# Patient Record
Sex: Female | Born: 1995 | Race: Black or African American | Hispanic: No | Marital: Single | Smoking: Never smoker
Health system: Southern US, Community
[De-identification: ages and names within clinical notes are randomized; demographics above are authoritative.]

## PROBLEM LIST (undated history)

## (undated) DIAGNOSIS — N6009 Solitary cyst of unspecified breast: Secondary | ICD-10-CM

---
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
## 2015-04-02 ENCOUNTER — Emergency Department
Admit: 2015-04-02 | Disposition: A | Discharge status: Home / Self Care | Payer: Self-pay | Admitting: Emergency Medicine

## 2015-04-02 LAB — URINALYSIS, COMPLETE
Bacteria: NONE SEEN
Bilirubin,UR: NEGATIVE
Blood: NEGATIVE
Glucose,UR: NEGATIVE mg/dL (ref 0–75)
NITRITE: NEGATIVE
Ph: 6 (ref 4.5–8.0)
Specific Gravity: 1.027 (ref 1.003–1.030)

## 2015-07-01 ENCOUNTER — Encounter: Payer: Self-pay | Admitting: Emergency Medicine

## 2015-07-01 ENCOUNTER — Emergency Department
Admission: EM | Admit: 2015-07-01 | Discharge: 2015-07-01 | Disposition: A | Discharge status: Home / Self Care | Attending: Emergency Medicine | Admitting: Emergency Medicine

## 2015-07-01 DIAGNOSIS — Z3202 Encounter for pregnancy test, result negative: Secondary | ICD-10-CM | POA: Diagnosis not present

## 2015-07-01 DIAGNOSIS — M545 Low back pain: Secondary | ICD-10-CM | POA: Diagnosis not present

## 2015-07-01 DIAGNOSIS — N946 Dysmenorrhea, unspecified: Secondary | ICD-10-CM

## 2015-07-01 DIAGNOSIS — R109 Unspecified abdominal pain: Secondary | ICD-10-CM | POA: Diagnosis present

## 2015-07-01 HISTORY — DX: Solitary cyst of unspecified breast: N60.09

## 2015-07-01 LAB — COMPREHENSIVE METABOLIC PANEL
ALT: 11 U/L — ABNORMAL LOW (ref 14–54)
AST: 18 U/L (ref 15–41)
Albumin: 4.5 g/dL (ref 3.5–5.0)
Alkaline Phosphatase: 49 U/L (ref 38–126)
Anion gap: 4 — ABNORMAL LOW (ref 5–15)
BILIRUBIN TOTAL: 0.3 mg/dL (ref 0.3–1.2)
BUN: 13 mg/dL (ref 6–20)
CHLORIDE: 110 mmol/L (ref 101–111)
CO2: 25 mmol/L (ref 22–32)
CREATININE: 0.75 mg/dL (ref 0.44–1.00)
Calcium: 9 mg/dL (ref 8.9–10.3)
GFR calc Af Amer: >60 mL/min (ref >60)
GFR calc non Af Amer: >60 mL/min (ref >60)
GLUCOSE: 109 mg/dL — AB (ref 65–99)
POTASSIUM: 3.5 mmol/L (ref 3.5–5.1)
Sodium: 139 mmol/L (ref 135–145)
Total Protein: 7.7 g/dL (ref 6.5–8.1)

## 2015-07-01 LAB — POCT PREGNANCY, URINE: PREG TEST UR: NEGATIVE

## 2015-07-01 LAB — URINALYSIS COMPLETE WITH MICROSCOPIC (ARMC ONLY)
Bacteria, UA: NONE SEEN
Bilirubin Urine: NEGATIVE
Glucose, UA: NEGATIVE mg/dL
KETONES UR: NEGATIVE mg/dL
Leukocytes, UA: NEGATIVE
NITRITE: NEGATIVE
PH: 6 (ref 5.0–8.0)
PROTEIN: NEGATIVE mg/dL
Specific Gravity, Urine: 1.024 (ref 1.005–1.030)

## 2015-07-01 LAB — LIPASE, BLOOD: LIPASE: 18 U/L — AB (ref 22–51)

## 2015-07-01 LAB — CBC
HCT: 37.9 % (ref 35.0–47.0)
Hemoglobin: 12.3 g/dL (ref 12.0–16.0)
MCH: 26.5 pg (ref 26.0–34.0)
MCHC: 32.5 g/dL (ref 32.0–36.0)
MCV: 81.7 fL (ref 80.0–100.0)
Platelets: 339 10*3/uL (ref 150–440)
RBC: 4.64 MIL/uL (ref 3.80–5.20)
RDW: 17 % — ABNORMAL HIGH (ref 11.5–14.5)
WBC: 8 10*3/uL (ref 3.6–11.0)

## 2015-07-01 MED ORDER — KETOROLAC TROMETHAMINE 60 MG/2ML IM SOLN
60.0000 mg | Freq: Once | INTRAMUSCULAR | Status: AC
  Administered 2015-07-01: 60 mg via INTRAMUSCULAR
  Filled 2015-07-01: qty 2

## 2015-07-01 NOTE — ED Notes (Signed)
Pt reports she is having period pains that are shooting down both legs and back. Menstral cycle started yesterday pains started today. Pt states she gets the same shooting pain down her legs every time she gets her period. Pt also states generalized abd pain.

## 2015-07-01 NOTE — ED Notes (Signed)
Pt states that she started her menstral cycle last night, pt states that she is having lower back pain that radiates into her legs. Pt states that she has a lot of pain each time she has a cycle but states that she has been unable to get any relief with any home remedies she has attempted. Pt also states that her period is usually pretty heavy as well pt estimates that she changes her pad approx 5 times an hour at it's heaviest. No distress noted at this time.

## 2015-07-01 NOTE — Discharge Instructions (Signed)
Please give the OB/GYN doctor a call to schedule an appointment. Please seek medical attention for any high fevers, chest pain, shortness of breath, change in behavior, persistent vomiting, bloody stool or any other new or concerning symptoms.  Dysmenorrhea Menstrual cramps (dysmenorrhea) are caused by the muscles of the uterus tightening (contracting) during a menstrual period. For some women, this discomfort is merely bothersome. For others, dysmenorrhea can be severe enough to interfere with everyday activities for a few days each month. Primary dysmenorrhea is menstrual cramps that last a couple of days when you start having menstrual periods or soon after. This often begins after a teenager starts having her period. As a woman gets older or has a baby, the cramps will usually lessen or disappear. Secondary dysmenorrhea begins later in life, lasts longer, and the pain may be stronger than primary dysmenorrhea. The pain may start before the period and last a few days after the period.  CAUSES  Dysmenorrhea is usually caused by an underlying problem, such as:  The tissue lining the uterus grows outside of the uterus in other areas of the body (endometriosis).  The endometrial tissue, which normally lines the uterus, is found in or grows into the muscular walls of the uterus (adenomyosis).  The pelvic blood vessels are engorged with blood just before the menstrual period (pelvic congestive syndrome).  Overgrowth of cells (polyps) in the lining of the uterus or cervix.  Falling down of the uterus (prolapse) because of loose or stretched ligaments.  Depression.  Bladder problems, infection, or inflammation.  Problems with the intestine, a tumor, or irritable bowel syndrome.  Cancer of the female organs or bladder.  A severely tipped uterus.  A very tight opening or closed cervix.  Noncancerous tumors of the uterus (fibroids).  Pelvic inflammatory disease (PID).  Pelvic scarring  (adhesions) from a previous surgery.  Ovarian cyst.  An intrauterine device (IUD) used for birth control. RISK FACTORS You may be at greater risk of dysmenorrhea if:  You are younger than age 6.  You started puberty early.  You have irregular or heavy bleeding.  You have never given birth.  You have a family history of this problem.  You are a smoker. SIGNS AND SYMPTOMS   Cramping or throbbing pain in your lower abdomen.  Headaches.  Lower back pain.  Nausea or vomiting.  Diarrhea.  Sweating or dizziness.  Loose stools. DIAGNOSIS  A diagnosis is based on your history, symptoms, physical exam, diagnostic tests, or procedures. Diagnostic tests or procedures may include:  Blood tests.  Ultrasonography.  An examination of the lining of the uterus (dilation and curettage, D&C).  An examination inside your abdomen or pelvis with a scope (laparoscopy).  X-rays.  CT scan.  MRI.  An examination inside the bladder with a scope (cystoscopy).  An examination inside the intestine or stomach with a scope (colonoscopy, gastroscopy). TREATMENT  Treatment depends on the cause of the dysmenorrhea. Treatment may include:  Pain medicine prescribed by your health care provider.  Birth control pills or an IUD with progesterone hormone in it.  Hormone replacement therapy.  Nonsteroidal anti-inflammatory drugs (NSAIDs). These may help stop the production of prostaglandins.  Surgery to remove adhesions, endometriosis, ovarian cyst, or fibroids.  Removal of the uterus (hysterectomy).  Progesterone shots to stop the menstrual period.  Cutting the nerves on the sacrum that go to the female organs (presacral neurectomy).  Electric current to the sacral nerves (sacral nerve stimulation).  Antidepressant medicine.  Psychiatric therapy,  counseling, or group therapy.  Exercise and physical therapy.  Meditation and yoga therapy.  Acupuncture. HOME CARE INSTRUCTIONS     Only take over-the-counter or prescription medicines as directed by your health care provider.  Place a heating pad or hot water bottle on your lower back or abdomen. Do not sleep with the heating pad.  Use aerobic exercises, walking, swimming, biking, and other exercises to help lessen the cramping.  Massage to the lower back or abdomen may help.  Stop smoking.  Avoid alcohol and caffeine. SEEK MEDICAL CARE IF:   Your pain does not get better with medicine.  You have pain with sexual intercourse.  Your pain increases and is not controlled with medicines.  You have abnormal vaginal bleeding with your period.  You develop nausea or vomiting with your period that is not controlled with medicine. SEEK IMMEDIATE MEDICAL CARE IF:  You pass out.  Document Released: 11/25/2005 Document Revised: 07/28/2013 Document Reviewed: 05/13/2013 [facility] Patient Information 2015 [place], [place]. This information is not intended to replace advice given to you by your health care provider. Make sure you discuss any questions you have with your health care provider.

## 2015-07-01 NOTE — ED Provider Notes (Signed)
[facility] Emergency Department Provider Note    ____________________________________________  Time seen: 1635  I have reviewed the triage vital signs and the nursing notes.   HISTORY  Chief Complaint Abdominal Cramping   History limited by: Not Limited   HPI Tammy Jefferson is a 19 y.o. female who presents to the emergency department today because of pelvic pain and lower back pain. Patient states that the pain started yesterday however became severe this morning. She states the pain does shoot down her legs. She does have some associated nausea with this. She has had this pain in the past. She gets it every time she begins her period. She has seen her primary care doctor for this and has tried multiple different pain medications as well as muscle relaxant and birth control to try to control the pain. None of these have been successful. Patient denies having seen an OB/GYN at this point.  Past Medical History  Diagnosis Date  . Cyst (solitary) of breast     There are no active problems to display for this patient.   History reviewed. No pertinent past surgical history.  No current outpatient prescriptions on file.  Allergies Review of patient's allergies indicates no known allergies.  No family history on file.  Social History History  Substance Use Topics  . Smoking status: Never Smoker   . Smokeless tobacco: Never Used  . Alcohol Use: No    Review of Systems  Constitutional: Negative for fever. Cardiovascular: Negative for chest pain. Respiratory: Negative for shortness of breath. Gastrointestinal: Positive for pelvic pain Genitourinary: Negative for dysuria. Musculoskeletal: Positive for low back pain Skin: Negative for rash. Neurological: Negative for headaches, focal weakness or numbness.   10-point ROS otherwise negative.  ____________________________________________   PHYSICAL EXAM:  VITAL SIGNS: ED Triage Vitals   Enc Vitals Group     BP 07/01/15 1254 129/94 mmHg     Pulse Rate 07/01/15 1254 97     Resp --      Temp 07/01/15 1254 98.7 F (37.1 C)     Temp Source 07/01/15 1254 Oral     SpO2 07/01/15 1254 100 %     Weight 07/01/15 1254 135 lb (61.236 kg)     Height 07/01/15 1254 5\' 4"  (1.626 m)     Head Cir --      Peak Flow --      Pain Score 07/01/15 1254 9   Constitutional: Alert and oriented. Well appearing and in no distress. Eyes: Conjunctivae are normal. PERRL. Normal extraocular movements. ENT   Head: Normocephalic and atraumatic.   Nose: No congestion/rhinnorhea.   Mouth/Throat: Mucous membranes are moist.   Neck: No stridor. Hematological/Lymphatic/Immunilogical: No cervical lymphadenopathy. Cardiovascular: Normal rate, regular rhythm.  No murmurs, rubs, or gallops. Respiratory: Normal respiratory effort without tachypnea nor retractions. Breath sounds are clear and equal bilaterally. No wheezes/rales/rhonchi. Gastrointestinal: Soft and minimally tender in bilateral lower abdomen. No distention. There is no CVA tenderness. Genitourinary: Deferred Musculoskeletal: Normal range of motion in all extremities. No joint effusions.  No lower extremity tenderness nor edema. Neurologic:  Normal speech and language. No gross focal neurologic deficits are appreciated. Speech is normal.  Skin:  Skin is warm, dry and intact. No rash noted. Psychiatric: Mood and affect are normal. Speech and behavior are normal. Patient exhibits appropriate insight and judgment.  ____________________________________________    LABS (pertinent positives/negatives)  Labs Reviewed  LIPASE, BLOOD - Abnormal; Notable for the following:    Lipase 18 (*)  All other components within normal limits  COMPREHENSIVE METABOLIC PANEL - Abnormal; Notable for the following:    Glucose, Bld 109 (*)    ALT 11 (*)    Anion gap 4 (*)    All other components within normal limits  CBC - Abnormal; Notable for  the following:    RDW 17.0 (*)    All other components within normal limits  URINALYSIS COMPLETEWITH MICROSCOPIC ([facility] ONLY) - Abnormal; Notable for the following:    Color, Urine YELLOW (*)    APPearance CLEAR (*)    Hgb urine dipstick 3+ (*)    Squamous Epithelial / LPF 0-5 (*)    All other components within normal limits  POCT PREGNANCY, URINE  POC URINE PREG, ED     ____________________________________________   EKG  None  ____________________________________________    RADIOLOGY  None  ____________________________________________   PROCEDURES  Procedure(s) performed: None  Critical Care performed: No  ____________________________________________   INITIAL IMPRESSION / ASSESSMENT AND PLAN / ED COURSE  Pertinent labs & imaging results that were available during my care of the patient were reviewed by me and considered in my medical decision making (see chart for details).  Patient presents to the emergency department today because of pelvic pain and low back pain. Patient does states she just started her period and gets this pain every time she starts her period. Physical exam is reassuring, nonsurgical abdomen. Blood work without any concerning findings. I do long discussion with patient and mother about importance of following up with OB/GYN. At this point I think patient might be suffering from endometritis. Will give dose of pain medication here and discharged with OB/GYN follow-up.  ____________________________________________   FINAL CLINICAL IMPRESSION(S) / ED DIAGNOSES  Final diagnoses:  Period pain     Nance Pear, MD 07/01/15 1654

## 2015-07-04 LAB — POCT PREGNANCY, URINE: Preg Test, Ur: POSITIVE — AB

## 2016-05-22 ENCOUNTER — Other Ambulatory Visit: Payer: Self-pay | Admitting: Adult Health

## 2016-05-22 DIAGNOSIS — N6002 Solitary cyst of left breast: Secondary | ICD-10-CM

## 2016-05-30 ENCOUNTER — Ambulatory Visit
Admission: RE | Admit: 2016-05-30 | Discharge: 2016-05-30 | Disposition: A | Discharge status: Home / Self Care | Source: Ambulatory Visit | Attending: Adult Health | Admitting: Adult Health

## 2016-05-30 DIAGNOSIS — N6002 Solitary cyst of left breast: Secondary | ICD-10-CM | POA: Insufficient documentation

## 2016-06-10 ENCOUNTER — Other Ambulatory Visit: Payer: Self-pay | Admitting: Adult Health

## 2016-06-10 DIAGNOSIS — N6320 Unspecified lump in the left breast, unspecified quadrant: Secondary | ICD-10-CM

## 2016-06-17 ENCOUNTER — Ambulatory Visit
Admission: RE | Admit: 2016-06-17 | Discharge: 2016-06-17 | Disposition: A | Discharge status: Home / Self Care | Payer: BLUE CROSS/BLUE SHIELD | Source: Ambulatory Visit | Attending: Adult Health | Admitting: Adult Health

## 2016-06-17 DIAGNOSIS — N6320 Unspecified lump in the left breast, unspecified quadrant: Secondary | ICD-10-CM

## 2016-06-17 DIAGNOSIS — D242 Benign neoplasm of left breast: Secondary | ICD-10-CM | POA: Diagnosis not present

## 2016-06-17 DIAGNOSIS — N63 Unspecified lump in breast: Secondary | ICD-10-CM | POA: Insufficient documentation

## 2016-06-17 DIAGNOSIS — N6092 Unspecified benign mammary dysplasia of left breast: Secondary | ICD-10-CM | POA: Insufficient documentation

## 2016-06-18 LAB — SURGICAL PATHOLOGY

## 2016-10-07 IMAGING — US US BREAST BX W LOC DEV 1ST LESION IMG BX SPEC US GUIDE*L*
1 series · 8 of 8 positions shown · non-contrast
Comparison: Previous exam(s).

ADDENDUM:
Pathology of the left breast biopsy revealed LEFT BREAST, UPPER
OUTER QUADRANT, [DATE]; ULTRASOUND-GUIDED BIOPSY: FIBROADENOMA WITH
MILD USUAL DUCTAL HYPERPLASIA. NEGATIVE FOR ATYPIA. This was found
to be concordant with Dr. Tarla impression and notes.

Recommendations: Clinical follow-up if any changes noticed by the
patient. Screening mammogram at age 39.
Multiple attempts were made by Faller, Mechta ([REDACTED]) to contact the patient by phone to discuss results and
recommendations as well as a post biopsy check, but were
unsuccessful. Messages were left for the patient, but she never
returned any calls. Debanji, nurse for [facility] [employer], NP was
notified on 06/20/16 by Faller, Mechta that we had been
unsuccessful in contacting the patient. Results and recommendations
were discussed with Debanji. She stated she will try to contact the
patient later in the day on 06/20/16.
Addendum by Faller, Mechta on 06/20/16.
The patient did return the call on 06/20/16 at [DATE]. Results and
recommendations were discussed with the patient. She stated she has
done well following the biopsy with only discomfort at the site on
the evening following the biopsy. Post biopsy instructions were
reviewed with the patient. She was encouraged to call the [HOSPITAL]
[REDACTED] with any further
questions or concerns.
CLINICAL DATA: 19-year-old female for tissue sampling of palpable
mass in the upper outer left breast.
EXAM:
ULTRASOUND GUIDED LEFT BREAST CORE NEEDLE BIOPSY

[Series 1: us breast bx w loc dev 1st lesion img bx spec us g · 0.07mm/px · 8 of 8 slices shown]
[im 1/8]
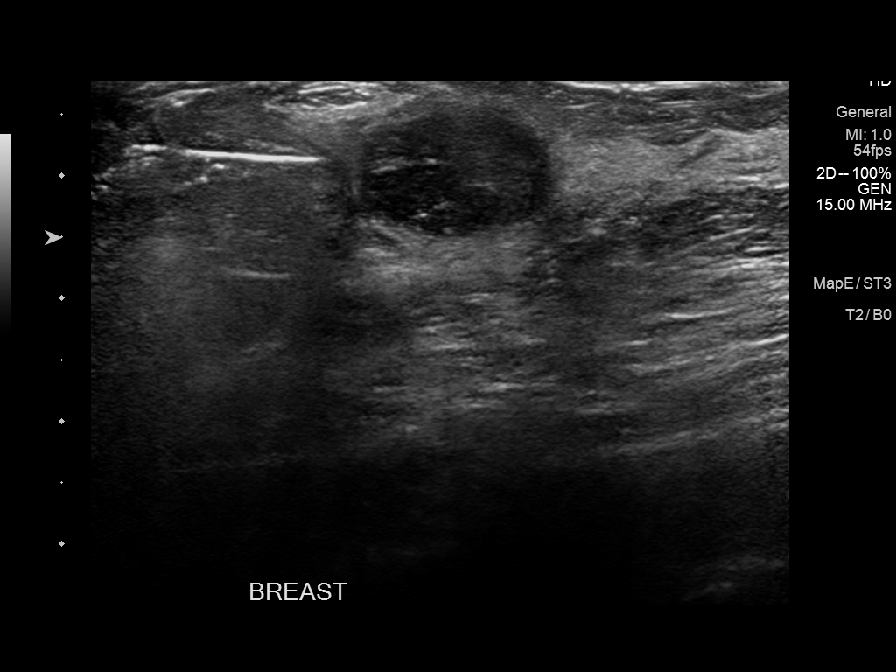
[im 2/8]
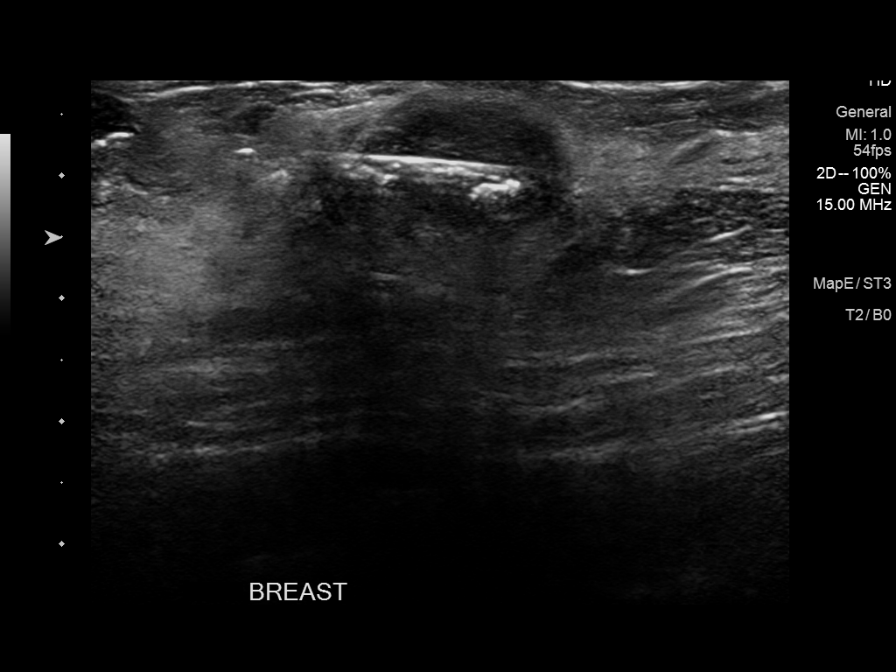
[im 3/8]
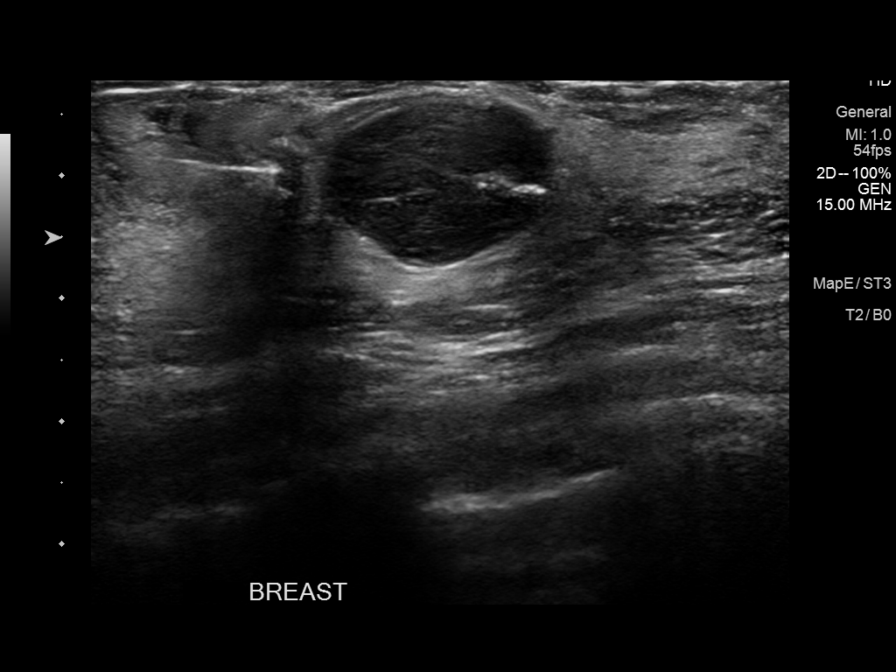
[im 4/8]
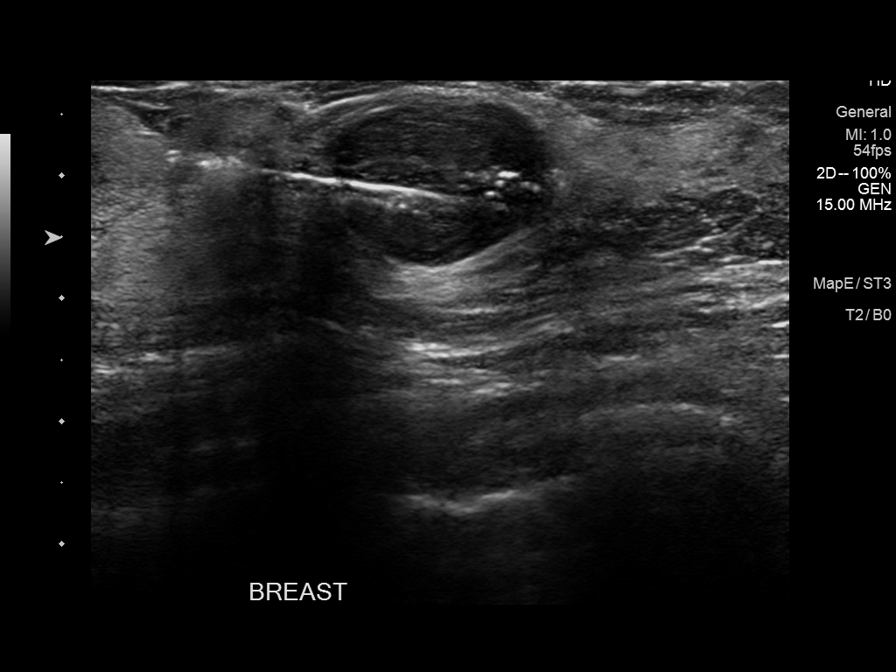
[im 5/8]
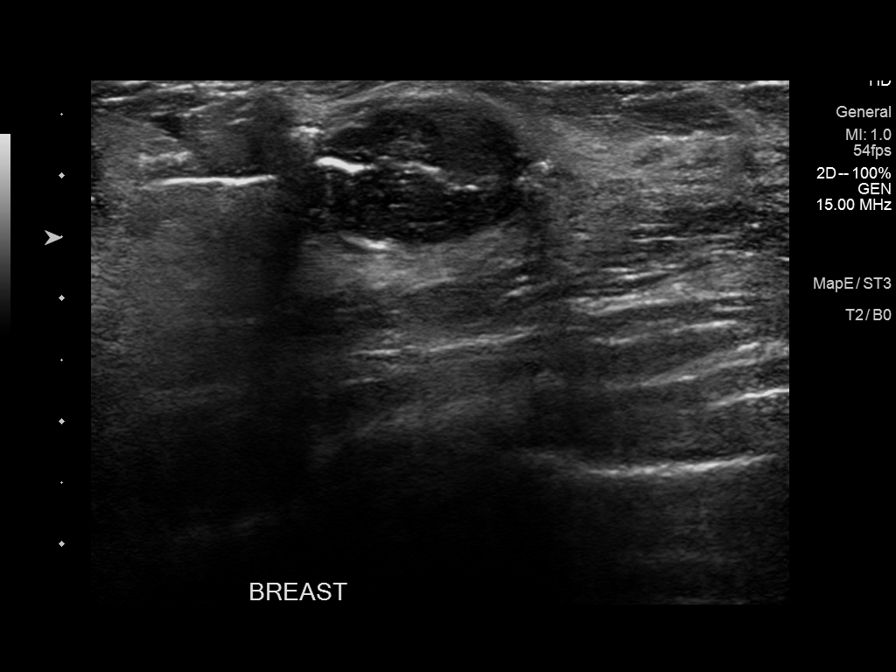
[im 6/8]
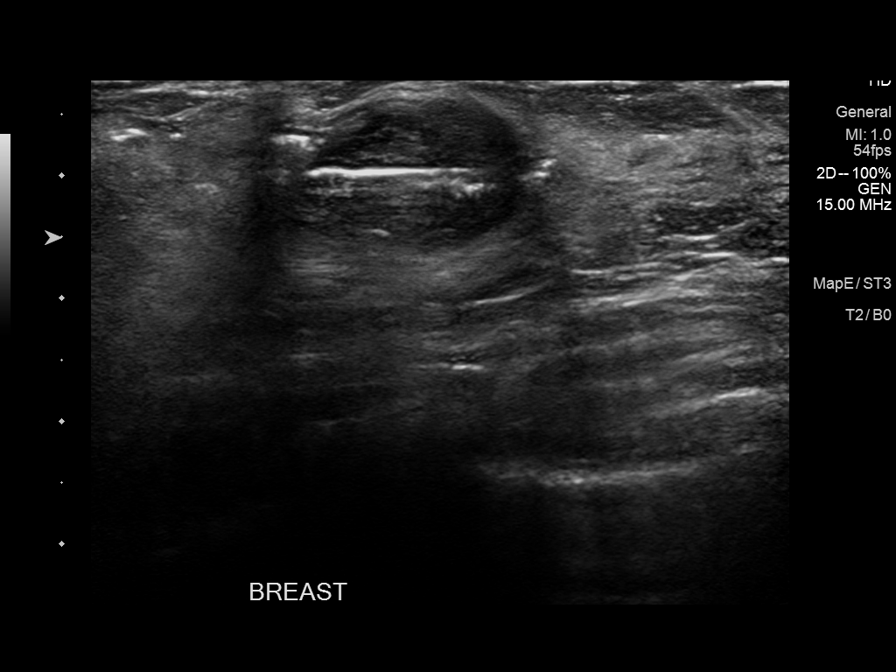
[im 7/8]
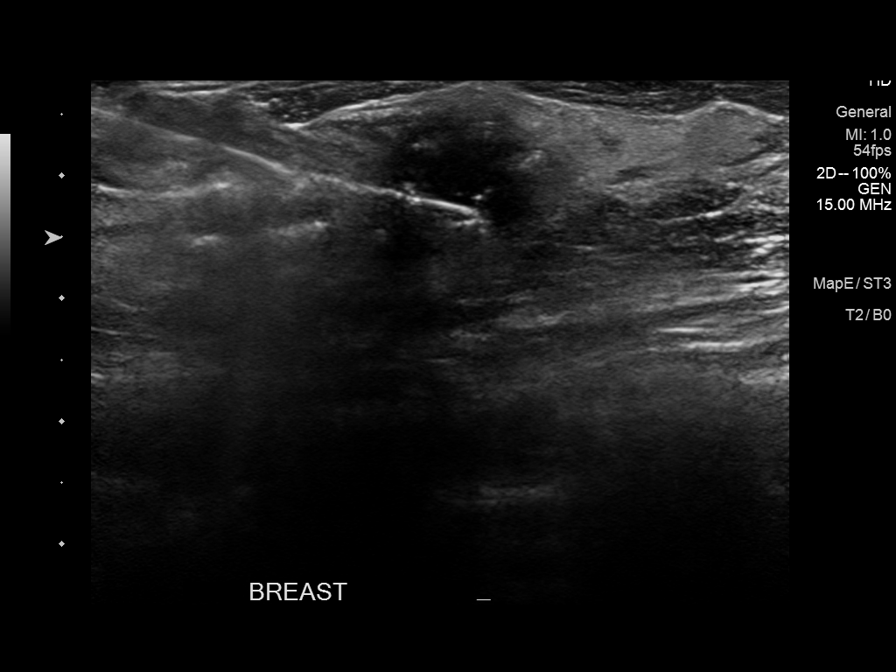
[im 8/8]
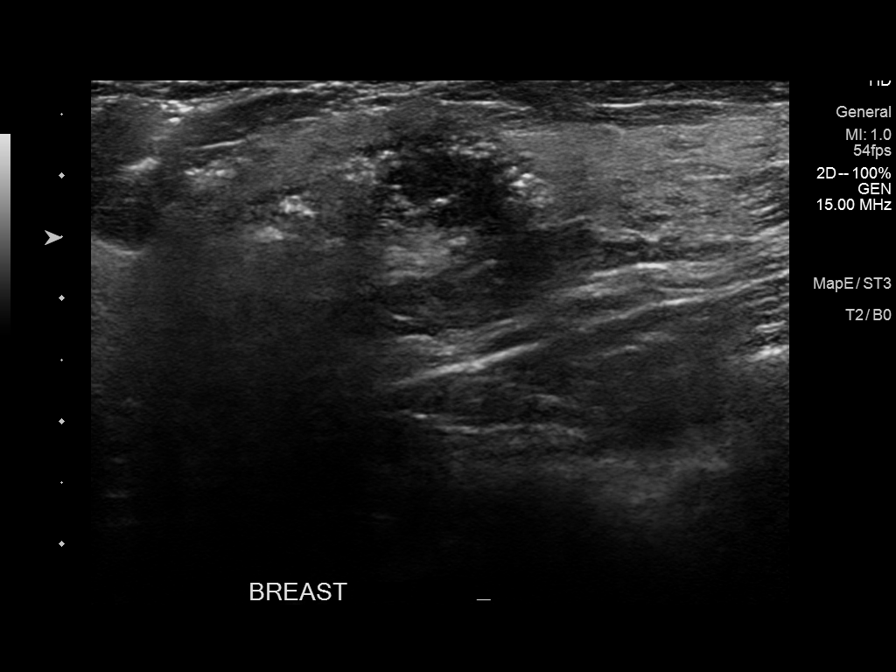

[8 of 8 positions shown; findings below may reference images not displayed]



Using sterile technique and 1% Lidocaine as local anesthetic, under
direct ultrasound visualization, a 14 gauge Gela device was
used to perform biopsy of the 2.1 cm mass at the 2 o'clock position
of the left breast using a medial approach. At the conclusion of the
procedure a coil shaped tissue marker clip was deployed into the
biopsy cavity with placement confirmed sonographically within the
superior aspect of the mass.
IMPRESSION: Ultrasound guided biopsy of left breast mass. No apparent
complications.

Pathology will be followed.

## 2017-05-10 IMAGING — US US BREAST*L* LIMITED INC AXILLA
1 series · 10 of 10 positions shown · non-contrast
Comparison: None.

CLINICAL DATA: 19-year-old female with left breast lump for several
months. The patient states that the lump has gotten larger since she
first noticed it.

EXAM:
ULTRASOUND OF THE LEFT BREAST

[Series 1: us breast*left* limited inc axilla · 0.06mm/px · 10 of 10 slices shown]
[im 1/10]
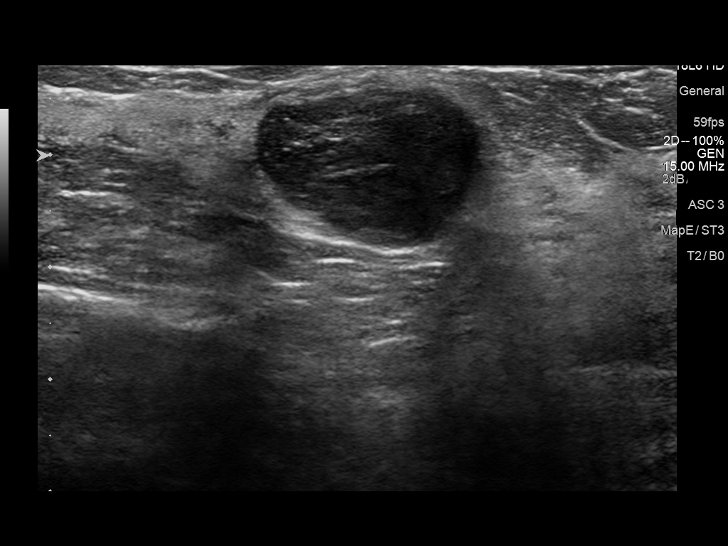
[im 2/10]
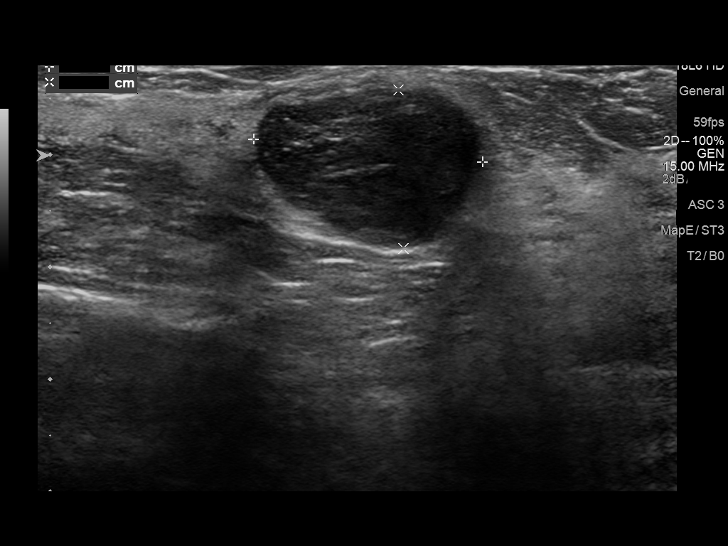
[im 3/10]
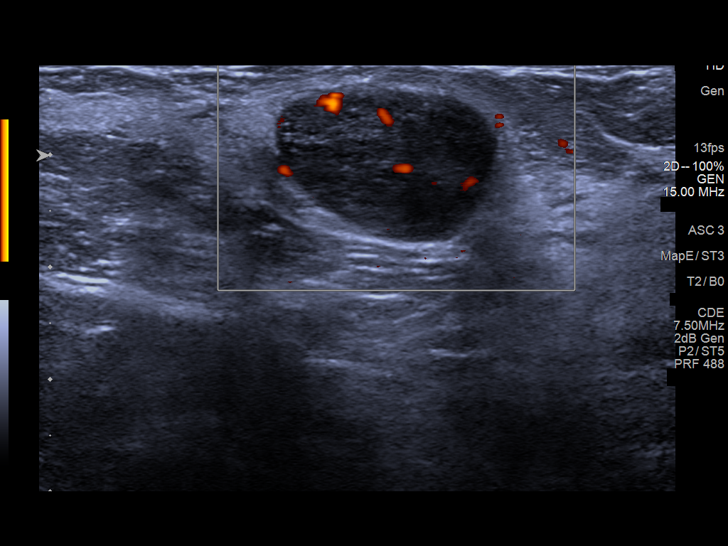
[im 4/10]
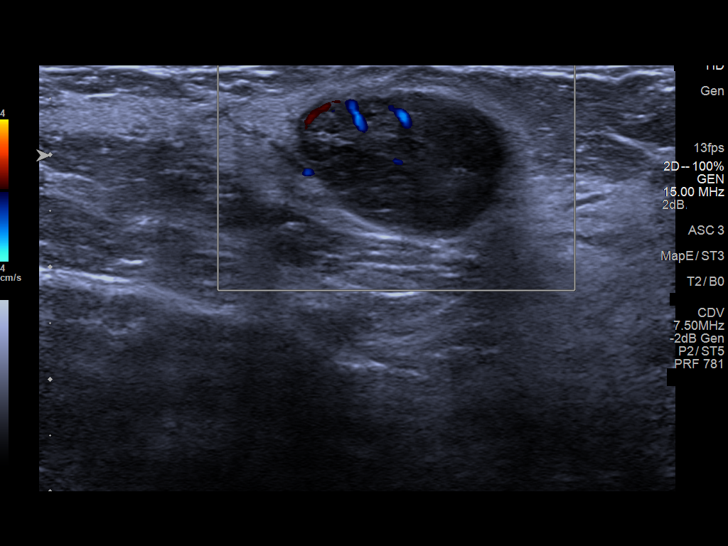
[im 5/10]
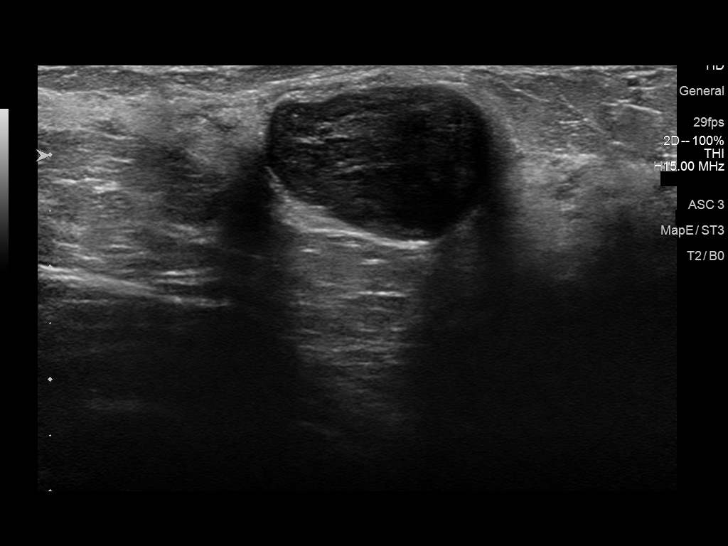
[im 6/10]
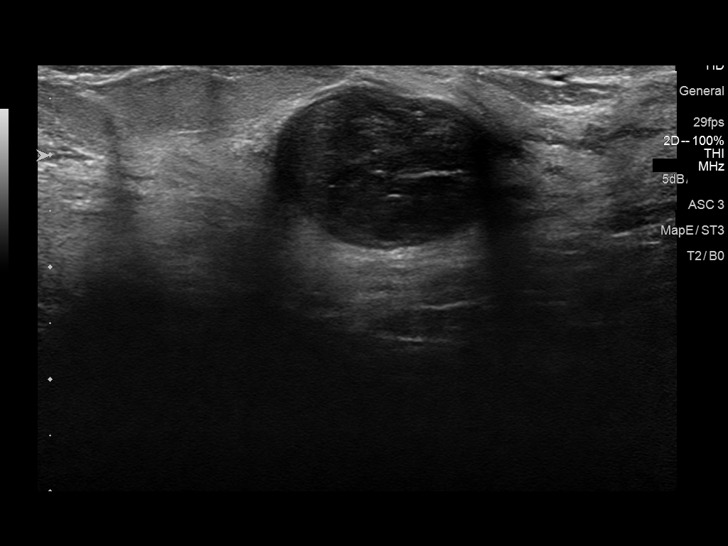
[im 7/10]
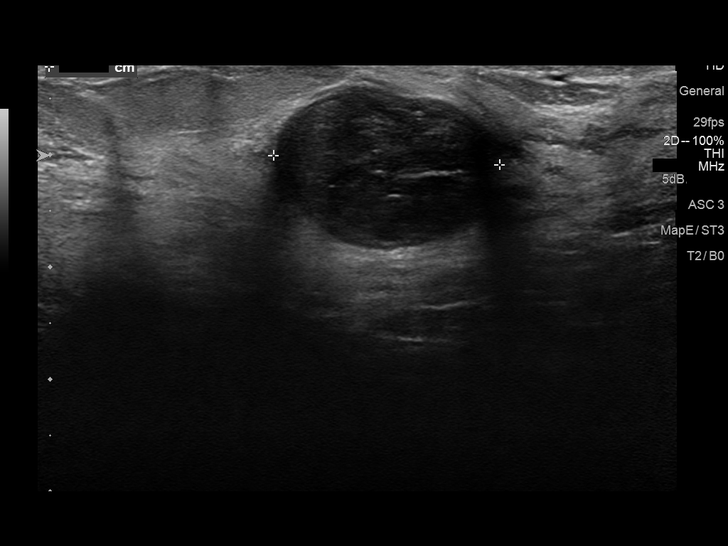
[im 8/10]
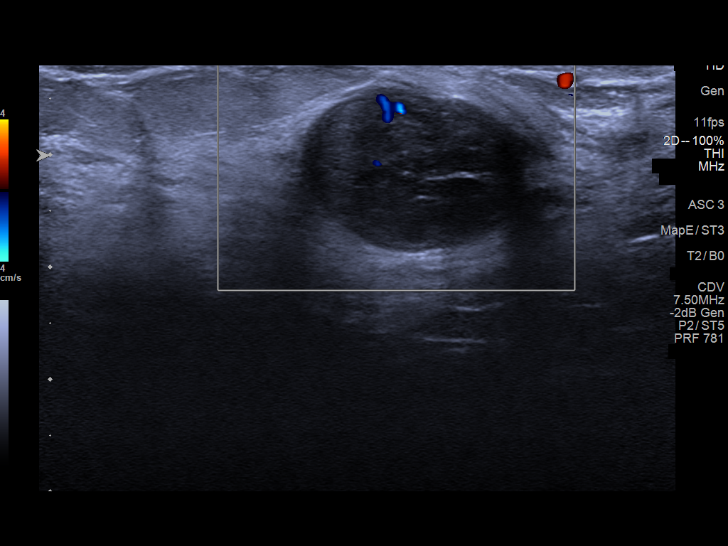
[im 9/10]
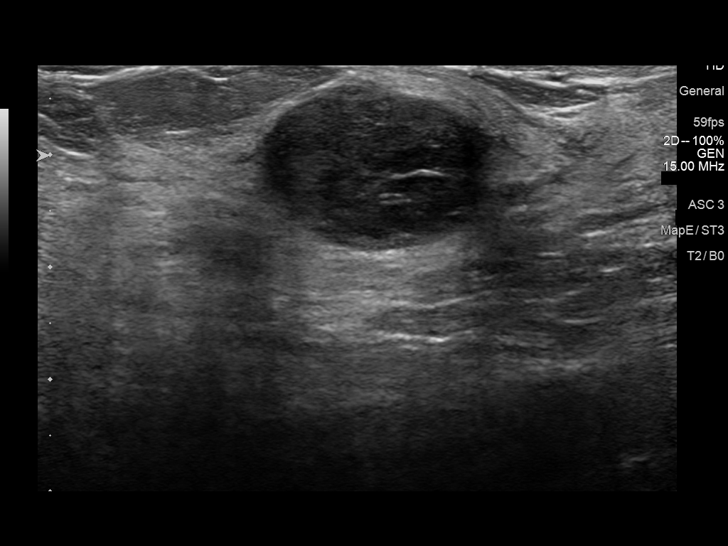
[im 10/10]
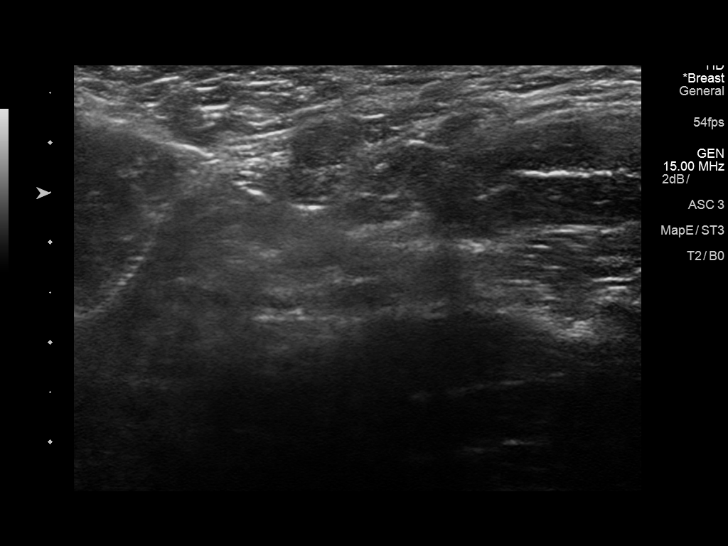

[10 of 10 positions shown; findings below may reference images not displayed]

FINDINGS: On physical exam, there is an approximately 2 cm mobile mass at 2
o'clock, 3 cm from the nipple, corresponding to the palpable
abnormality.

Targeted ultrasound is performed, showing an oval, circumscribed,
hypoechoic mass at 2 o'clock, 3 cm from the nipple measuring 2.1 x
1.4 x 2 cm, corresponding to the palpable abnormality. This mass
likely represents a fibroadenoma.
IMPRESSION: While the left breast mass at 2 o'clock, 3 cm from the nipple likely
represents a fibroadenoma, given the reported history of
enlargement, an ultrasound-guided biopsy is recommended.

RECOMMENDATION:
Ultrasound-guided left breast biopsy. Options including
ultrasound-guided biopsy versus surgical consultation were discussed
with the patient.

I have discussed the findings and recommendations with the patient.
Results were also provided in writing at the conclusion of the
visit. If applicable, a reminder letter will be sent to the patient
regarding the next appointment.

BI-RADS CATEGORY  4: Suspicious abnormality - biopsy should be
considered.

## 2018-01-07 ENCOUNTER — Other Ambulatory Visit: Payer: Self-pay | Admitting: Internal Medicine

## 2018-01-07 DIAGNOSIS — N920 Excessive and frequent menstruation with regular cycle: Secondary | ICD-10-CM

## 2018-01-07 DIAGNOSIS — N6009 Solitary cyst of unspecified breast: Secondary | ICD-10-CM

## 2018-01-12 ENCOUNTER — Ambulatory Visit

## 2018-01-13 ENCOUNTER — Ambulatory Visit: Payer: BLUE CROSS/BLUE SHIELD

## 2018-01-13 ENCOUNTER — Ambulatory Visit
Admission: RE | Admit: 2018-01-13 | Discharge: 2018-01-13 | Disposition: A | Discharge status: Home / Self Care | Payer: BLUE CROSS/BLUE SHIELD | Source: Ambulatory Visit | Attending: Internal Medicine | Admitting: Internal Medicine

## 2018-01-13 DIAGNOSIS — N6009 Solitary cyst of unspecified breast: Secondary | ICD-10-CM | POA: Insufficient documentation

## 2018-01-26 ENCOUNTER — Ambulatory Visit
Admission: RE | Admit: 2018-01-26 | Discharge: 2018-01-26 | Disposition: A | Discharge status: Home / Self Care | Payer: BLUE CROSS/BLUE SHIELD | Source: Ambulatory Visit | Attending: Internal Medicine | Admitting: Internal Medicine

## 2018-01-26 DIAGNOSIS — N920 Excessive and frequent menstruation with regular cycle: Secondary | ICD-10-CM | POA: Insufficient documentation

## 2019-08-27 ENCOUNTER — Other Ambulatory Visit: Payer: Self-pay | Admitting: Maternal Newborn

## 2019-08-27 ENCOUNTER — Other Ambulatory Visit (HOSPITAL_COMMUNITY)
Admission: RE | Admit: 2019-08-27 | Discharge: 2019-08-27 | Disposition: A | Discharge status: Home / Self Care | Payer: BC Managed Care – PPO | Source: Ambulatory Visit | Attending: Maternal Newborn | Admitting: Maternal Newborn

## 2019-08-27 ENCOUNTER — Encounter: Payer: Self-pay | Admitting: Maternal Newborn

## 2019-08-27 ENCOUNTER — Other Ambulatory Visit: Payer: Self-pay

## 2019-08-27 ENCOUNTER — Ambulatory Visit (INDEPENDENT_AMBULATORY_CARE_PROVIDER_SITE_OTHER): Payer: BC Managed Care – PPO | Admitting: Maternal Newborn

## 2019-08-27 VITALS — BP 120/80 | Wt 133.0 lb

## 2019-08-27 DIAGNOSIS — Z3400 Encounter for supervision of normal first pregnancy, unspecified trimester: Secondary | ICD-10-CM

## 2019-08-27 DIAGNOSIS — Z3201 Encounter for pregnancy test, result positive: Secondary | ICD-10-CM

## 2019-08-27 DIAGNOSIS — Z3401 Encounter for supervision of normal first pregnancy, first trimester: Secondary | ICD-10-CM | POA: Diagnosis not present

## 2019-08-27 DIAGNOSIS — Z369 Encounter for antenatal screening, unspecified: Secondary | ICD-10-CM

## 2019-08-27 DIAGNOSIS — Z3A12 12 weeks gestation of pregnancy: Secondary | ICD-10-CM | POA: Diagnosis not present

## 2019-08-27 LAB — POCT URINALYSIS DIPSTICK OB
Glucose, UA: NEGATIVE
POC,PROTEIN,UA: NEGATIVE

## 2019-08-27 LAB — OB RESULTS CONSOLE GC/CHLAMYDIA: Gonorrhea: NEGATIVE

## 2019-08-27 LAB — OB RESULTS CONSOLE VARICELLA ZOSTER ANTIBODY, IGG: Varicella: NON-IMMUNE/NOT IMMUNE

## 2019-08-27 LAB — POCT URINE PREGNANCY: Preg Test, Ur: POSITIVE — AB

## 2019-08-27 NOTE — Progress Notes (Signed)
08/27/2019   Chief Complaint: Desires prenatal care.  Transfer of Care Patient: no  History of Present Illness: Tammy Jefferson is a 23 y.o. G1P0 at 62w0dbased on Patient's last menstrual period on 06/04/2019, with an Estimated Date of Delivery: 03/10/2020, with the above CC.   Her periods were: regular periods every 30-31 days, lasting 6-7 days. LMP was a bit lighter than usual. She has Positive signs or symptoms of nausea/vomiting of pregnancy. Has emesis about every other day. She has Negative signs or symptoms of miscarriage or preterm labor She identifies Negative Zika risk factors for her and her partner On any different medications around the time she conceived/early pregnancy: No  History of varicella: No   Review of Systems  Constitutional: Negative.   HENT: Negative.   Eyes:       Sty on right eyelid  Respiratory: Negative for cough, shortness of breath and wheezing.   Cardiovascular: Negative for chest pain and palpitations.  Gastrointestinal: Positive for nausea and vomiting.  Genitourinary: Negative.   Musculoskeletal: Negative.   Skin: Negative.   Neurological: Negative.   Endo/Heme/Allergies: Negative.   Psychiatric/Behavioral: Negative.   Breasts: Positive for breast tenderness. Negative for new or changing breast lumps or nipple discharge.  Review of systems was otherwise negative, except as stated in the above HPI.  OBGYN History: As per HPI. OB History  Gravida Para Term Preterm AB Living  1            SAB TAB Ectopic Multiple Live Births               # Outcome Date GA Lbr Len/2nd Weight Sex Delivery Anes PTL Lv  1 Current             Any issues with any prior pregnancies: not applicable Any prior children are healthy, doing well, without any problems or issues: not applicable History of pap smears: Yes. Last pap smear 03/09/2018. Completion documented in scanned records. Need result record; patient reported NILM. History of STIs: No   Past Medical  History: Past Medical History:  Diagnosis Date  . Cyst (solitary) of breast     Past Surgical History: No past surgical history on file.  Family History:  Family History  Problem Relation Age of Onset  . Cancer Maternal Grandmother 240      breast  . Cancer Maternal Aunt 24       breast   She does not have a history of any female cancers, bleeding or blood clotting disorders.  There is no history of intellectual disability, birth defects or genetic disorders in her or the FOB's history  Social History:  Social History   Socioeconomic History  . Marital status: Single    Spouse name: Not on file  . Number of children: Not on file  . Years of education: Not on file  . Highest education level: Not on file  Occupational History  . Not on file  Social Needs  . Financial resource strain: Not on file  . Food insecurity    Worry: Not on file    Inability: Not on file  . Transportation needs    Medical: Not on file    Non-medical: Not on file  Tobacco Use  . Smoking status: Never Smoker  . Smokeless tobacco: Never Used  Substance and Sexual Activity  . Alcohol use: No  . Drug use: Yes    Types: Marijuana  . Sexual activity: Yes  Lifestyle  . Physical activity  Days per week: Not on file    Minutes per session: Not on file  . Stress: Not on file  Relationships  . Social Herbalist on phone: Not on file    Gets together: Not on file    Attends religious service: Not on file    Active member of club or organization: Not on file    Attends meetings of clubs or organizations: Not on file    Relationship status: Not on file  . Intimate partner violence    Fear of current or ex partner: Not on file    Emotionally abused: Not on file    Physically abused: Not on file    Forced sexual activity: Not on file  Other Topics Concern  . Not on file  Social History Narrative  . Not on file   Any cats in the household: no Domestic violence screening is  negative  Allergy: No Known Allergies  Current Outpatient Medications:  Current Outpatient Medications:  .  Cholecalciferol (VITAMIN D3) 1.25 MG (50000 UT) CAPS, TK 1 C PO ONCE A WK, Disp: , Rfl:  .  Prenatal Vit-Fe Fumarate-FA (PRENATAL VITAMINS) 28-0.8 MG TABS, TK 1 T PO ONCE A DAY, Disp: , Rfl:    Physical Exam:   BP 120/80   Wt 133 lb (60.3 kg)   LMP 06/04/2019   BMI 22.83 kg/m  Body mass index is 22.83 kg/m. Constitutional: Well nourished, well developed female in no acute distress.  Neck:  Supple, normal appearance, and no thyromegaly  Cardiovascular: S1, S2 normal, no murmur, rub or gallop, regular rate and rhythm Respiratory:  Clear to auscultation bilaterally. Normal respiratory effort Abdomen: no masses, hernias; diffusely non tender to palpation, non distended Breasts: breasts appear normal, no suspicious masses, no skin or nipple changes or axillary nodes. Neuro/Psych:  Normal mood and affect.  Skin:  Warm and dry.  Lymphatic:  No inguinal lymphadenopathy.   Pelvic exam: is not limited by body habitus External genitalia, Bartholin's glands, Urethra, Skene's glands: within normal limits Vagina: within normal limits and with no blood in the vault  Cervix: speculum exam declined after shared decision making Uterus:  enlarged, c/w 12 week size Adnexa:  negative for enlargement and mass and positive for: tenderness on the left side  Assessment: Tammy Jefferson is a 23 y.o. G1P0 at 51w0dbased on Patient's last menstrual period on 06/04/2019, with an Estimated Date of Delivery: 03/10/2020, presenting for prenatal care.  Plan:  1) Avoid alcoholic beverages. 2) Patient encouraged not to smoke. Recently quit upon news of pregnancy. 3) Discontinue the use of all non-medicinal drugs and chemicals.  4) Take prenatal vitamins daily.  5) Seatbelt use advised 6) Nutrition, food safety, and exercise discussed. 7[facility]  8) Patient  is asked about travel to areas at risk for the ZManyvirus, and counseled to avoid travel and exposure to mosquitoes or  partners who may have themselves been exposed to the virus.  9) [facility] 10) Genetic Screening, such as with 1st Trimester Screening, cell free fetal DNA, AFP testing, and Ultrasound, is discussed with patient. She is considering genetic testing this pregnancy. 11) NOB labs today. 12) Dating scan ordered. 132 Discussed Myriad screening with family history of breast cancer; patient is considering. 14) Bonjesta sample given, will call for Rx if effective. 15) Awaiting results of EKG for murmur auscultated by PCP; could not hear today.  Problem list  reviewed and updated.  Avel Sensor, CNM [facility] 08/27/2019  2:40 PM

## 2019-08-29 LAB — URINE CULTURE: Organism ID, Bacteria: NO GROWTH

## 2019-08-30 LAB — RPR+RH+ABO+RUB AB+AB SCR+CB...
Antibody Screen: NEGATIVE
HIV Screen 4th Generation wRfx: NONREACTIVE
Hematocrit: 35.6 % (ref 34.0–46.6)
Hemoglobin: 11.8 g/dL (ref 11.1–15.9)
Hepatitis B Surface Ag: NEGATIVE
MCH: 28 pg (ref 26.6–33.0)
MCHC: 33.1 g/dL (ref 31.5–35.7)
MCV: 85 fL (ref 79–97)
Platelets: 372 10*3/uL (ref 150–450)
RBC: 4.21 x10E6/uL (ref 3.77–5.28)
RDW: 13.7 % (ref 11.7–15.4)
RPR Ser Ql: NONREACTIVE
Rh Factor: POSITIVE
Rubella Antibodies, IGG: 17.4 index (ref >0.99)
Varicella zoster IgG: <135 index — ABNORMAL LOW (ref >165)
WBC: 6.1 10*3/uL (ref 3.4–10.8)

## 2019-08-30 LAB — HEMOGLOBINOPATHY EVALUATION
HGB C: 0 %
HGB S: 0 %
HGB VARIANT: 0 %
Hemoglobin A2 Quantitation: 2.8 % (ref 1.8–3.2)
Hemoglobin F Quantitation: 0 % (ref 0.0–2.0)
Hgb A: 97.2 % (ref 96.4–98.8)

## 2019-08-31 LAB — CERVICOVAGINAL ANCILLARY ONLY
Chlamydia: NEGATIVE
Neisseria Gonorrhea: NEGATIVE

## 2019-09-03 LAB — URINE DRUG PANEL 7
Amphetamines, Urine: NEGATIVE ng/mL
Barbiturate Quant, Ur: NEGATIVE ng/mL
Benzodiazepine Quant, Ur: NEGATIVE ng/mL
Cannabinoid Quant, Ur: POSITIVE — AB
Cocaine (Metab.): NEGATIVE ng/mL
Opiate Quant, Ur: NEGATIVE ng/mL
PCP Quant, Ur: NEGATIVE ng/mL

## 2019-09-07 ENCOUNTER — Ambulatory Visit (INDEPENDENT_AMBULATORY_CARE_PROVIDER_SITE_OTHER): Payer: BC Managed Care – PPO

## 2019-09-07 ENCOUNTER — Encounter: Payer: Self-pay | Admitting: Maternal Newborn

## 2019-09-07 ENCOUNTER — Other Ambulatory Visit: Payer: Self-pay

## 2019-09-07 ENCOUNTER — Ambulatory Visit (INDEPENDENT_AMBULATORY_CARE_PROVIDER_SITE_OTHER): Payer: BC Managed Care – PPO | Admitting: Maternal Newborn

## 2019-09-07 VITALS — BP 122/74 | Wt 131.0 lb

## 2019-09-07 DIAGNOSIS — Z3A13 13 weeks gestation of pregnancy: Secondary | ICD-10-CM

## 2019-09-07 DIAGNOSIS — Z3A09 9 weeks gestation of pregnancy: Secondary | ICD-10-CM

## 2019-09-07 DIAGNOSIS — Z369 Encounter for antenatal screening, unspecified: Secondary | ICD-10-CM

## 2019-09-07 DIAGNOSIS — N8311 Corpus luteum cyst of right ovary: Secondary | ICD-10-CM | POA: Diagnosis not present

## 2019-09-07 DIAGNOSIS — O3481 Maternal care for other abnormalities of pelvic organs, first trimester: Secondary | ICD-10-CM | POA: Diagnosis not present

## 2019-09-07 DIAGNOSIS — O219 Vomiting of pregnancy, unspecified: Secondary | ICD-10-CM

## 2019-09-07 DIAGNOSIS — Z3400 Encounter for supervision of normal first pregnancy, unspecified trimester: Secondary | ICD-10-CM

## 2019-09-07 MED ORDER — PROMETHAZINE HCL 25 MG PO TABS: 25.0000 mg | ORAL_TABLET | Freq: Four times a day (QID) | ORAL | 2 refills | Status: DC | PRN

## 2019-09-07 NOTE — Progress Notes (Signed)
No vb. No lof. A lot of nausea.

## 2019-09-07 NOTE — Progress Notes (Signed)
    Routine Prenatal Care Visit  Subjective  Tammy Jefferson is a 23 y.o. G1P0 at 77w4dbeing seen today for ongoing prenatal care.  She is currently monitored for the following issues for this low-risk pregnancy and has Supervision of normal first pregnancy, antepartum on their problem list.  ----------------------------------------------------------------------------------- Patient reports nausea and vomiting, increase in episodes of emesis. Bonjesta did not help. Feeling fatigued. Vag. Bleeding: None.  ----------------------------------------------------------------------------------- The following portions of the patient's history were reviewed and updated as appropriate: allergies, current medications, past family history, past medical history, past social history, past surgical history and problem list. Problem list updated.   Objective  Blood pressure 122/74, weight 131 lb (59.4 kg), last menstrual period 06/04/2019. Pregravid weight 138 lb (62.6 kg) Total Weight Gain -7 lb (-3.175 kg) Urine dipstick shows negative for nitrites, leukocytes, red blood cells, glucose, protein.  Fetal Status: Fetal Heart Rate (bpm): 182 ([place]         General:  Alert, oriented and cooperative. Patient is in no acute distress.  Skin: Skin is warm and dry. No rash noted.   Cardiovascular: Normal heart rate noted  Respiratory: Normal respiratory effort, no problems with respiration noted  Abdomen: Soft, gravid, appropriate for gestational age. Pain/Pressure: Absent     Pelvic:  Cervical exam deferred        Extremities: Normal range of motion.     Mental Status: Normal mood and affect. Normal behavior. Normal judgment and thought content.     Assessment   23y.o. G1P0 at 130w4dEDD 03/10/2020 by Last Menstrual Period presenting for a routine prenatal visit.  Plan   pregnancy1 Problems (from 06/04/19 to present)    Problem Noted Resolved   Supervision of normal first pregnancy, antepartum  08/27/2019 by ScRexene AgentCNM No   Overview Signed 08/27/2019  2:41 PM by ScRexene AgentCNMound Stationrenatal Labs  Dating  Blood type:     Genetic Screen 1 Screen:    AFP:     Quad:     NIPS: Antibody:   Anatomic [place]ella:   Varicella:    GTT Early:               Third trimester:  RPR:     Rhogam  HBsAg:     TDaP vaccine                       Flu Shot: HIV:     Baby Food                                GBS:   Contraception  Pap:  CBB     CS/VBAC    Support Person               Dating scan today shows singleton IUP at 9w50w5dHR 182 bpm, EDD changed to 04/06/2020 per ACOG Guidelines. Results reviewed with patient.  Will Rx Phenergan due to frequent emesis and lack of improvement in nausea with Bonjesta.  UA done as patient was concerned about urinary frequency; negative.  Discussed genetic screening; current plan is to decline first trimester screen and still considering Myriad screening.  Please refer to After Visit Summary for other counseling recommendations.   Return in about 4 weeks (around 10/05/2019) [place].  JacAvel SensorNM 09/07/2019  3:57 PM

## 2019-09-08 DIAGNOSIS — O219 Vomiting of pregnancy, unspecified: Secondary | ICD-10-CM | POA: Insufficient documentation

## 2019-09-20 ENCOUNTER — Ambulatory Visit (INDEPENDENT_AMBULATORY_CARE_PROVIDER_SITE_OTHER): Payer: BC Managed Care – PPO | Admitting: Advanced Practice Midwife

## 2019-09-20 ENCOUNTER — Other Ambulatory Visit: Payer: Self-pay

## 2019-09-20 ENCOUNTER — Encounter: Payer: Self-pay | Admitting: Advanced Practice Midwife

## 2019-09-20 VITALS — BP 100/60 | Wt 128.0 lb

## 2019-09-20 DIAGNOSIS — O219 Vomiting of pregnancy, unspecified: Secondary | ICD-10-CM

## 2019-09-20 DIAGNOSIS — Z3A11 11 weeks gestation of pregnancy: Secondary | ICD-10-CM

## 2019-09-20 MED ORDER — ONDANSETRON 4 MG PO TBDP: 4.0000 mg | ORAL_TABLET | Freq: Three times a day (TID) | ORAL | 2 refills | Status: DC | PRN

## 2019-09-20 NOTE — Progress Notes (Signed)
Routine Prenatal Care Visit  Subjective  Tammy Jefferson is a 23 y.o. G1P0 at [redacted]w[redacted]d being seen today for ongoing prenatal care.  She is currently monitored for the following issues for this low-risk pregnancy and has Supervision of normal first pregnancy, antepartum and Nausea and vomiting during pregnancy on their problem list.  ----------------------------------------------------------------------------------- Patient reports continued nausea and vomiting. Tammy Jefferson was not helping and Phenergan is causing her to be too sleepy to do her job. She is wondering what other medication she can try. She admits vomiting 5 to 6 times per day. She is keeping very little down and is feeling stressed and frustrated. She feels she has tried everything. We discussed trying zofran and sea bands. If those don't work she may need admission for IV fluids/electrolyte replenishment.  She also suggested that she may need a note for a couple weeks off work to recover from N/V since she feels worse at work. She will try zofran.   . Vag. Bleeding: None.   . Leaking Fluid denies.  ----------------------------------------------------------------------------------- The following portions of the patient's history were reviewed and updated as appropriate: allergies, current medications, past family history, past medical history, past social history, past surgical history and problem list. Problem list updated.  Objective  Blood pressure 100/60, weight 128 lb (58.1 kg), last menstrual period 06/04/2019. Pregravid weight 138 lb (62.6 kg) Total Weight Gain -10 lb (-4.536 kg) Urinalysis: Urine Protein    Urine Glucose    Fetal Status: Fetal Heart Rate (bpm): 170         General:  Alert, oriented and cooperative. Patient is in no acute distress.  Skin: Skin is warm and dry. No rash noted.   Cardiovascular: Normal heart rate noted  Respiratory: Normal respiratory effort, no problems with respiration noted  Abdomen: Soft,  gravid, appropriate for gestational age. Pain/Pressure: Present     Pelvic:  Cervical exam deferred        Extremities: Normal range of motion.     Mental Status: Normal mood and affect. Normal behavior. Normal judgment and thought content.   Assessment   23 y.o. G1P0 at [redacted]w[redacted]d by  04/06/2020, by Ultrasound presenting for work-in prenatal visit  Plan   pregnancy1 Problems (from 06/04/19 to present)    Problem Noted Resolved   Supervision of normal first pregnancy, antepartum 08/27/2019 by Rexene Agent, CNM No   Overview Signed 08/27/2019  2:41 PM by Rexene Agent, [place] Prenatal Labs  Dating  Blood type:     Genetic Screen 1 Screen:    AFP:     Quad:     NIPS: Antibody:   Anatomic [place]  Rubella:   Varicella:    GTT Early:               Third trimester:  RPR:     Rhogam  HBsAg:     TDaP vaccine                       Flu Shot: HIV:     Baby Food                                GBS:   Contraception  Pap:  CBB     CS/VBAC    Support Person                  Preterm labor symptoms and  general obstetric precautions including but not limited to vaginal bleeding, contractions, leaking of fluid and fetal movement were reviewed in detail with the patient. Please refer to After Visit Summary for other counseling recommendations.  Rx zofran, try to stay hydrated, eat small/frequent amounts including at bedtime and first thing in the morning, Sea Bands  Return for follow up already scheduled.  Rod Can, CNM 09/20/2019 11:51 AM

## 2019-09-20 NOTE — Patient Instructions (Signed)

## 2019-09-21 ENCOUNTER — Telehealth: Payer: Self-pay

## 2019-09-21 NOTE — Telephone Encounter (Signed)
Patient saw Tammy Jefferson yesterday and was given antinausea med and instructed to call back in 24 hours if the nausea had not changed so she can get set up to go to hospital for fluids. [phone]

## 2019-09-22 ENCOUNTER — Inpatient Hospital Stay
Admission: RE | Admit: 2019-09-22 | Discharge: 2019-09-24 | DRG: 833 | Disposition: A | Discharge status: Home / Self Care | Payer: BC Managed Care – PPO | Attending: Obstetrics & Gynecology | Admitting: Obstetrics & Gynecology

## 2019-09-22 DIAGNOSIS — Z20828 Contact with and (suspected) exposure to other viral communicable diseases: Secondary | ICD-10-CM | POA: Diagnosis present

## 2019-09-22 DIAGNOSIS — O210 Mild hyperemesis gravidarum: Secondary | ICD-10-CM | POA: Diagnosis not present

## 2019-09-22 DIAGNOSIS — O211 Hyperemesis gravidarum with metabolic disturbance: Principal | ICD-10-CM | POA: Diagnosis present

## 2019-09-22 DIAGNOSIS — O99611 Diseases of the digestive system complicating pregnancy, first trimester: Secondary | ICD-10-CM | POA: Diagnosis present

## 2019-09-22 DIAGNOSIS — K219 Gastro-esophageal reflux disease without esophagitis: Secondary | ICD-10-CM | POA: Diagnosis present

## 2019-09-22 DIAGNOSIS — O99281 Endocrine, nutritional and metabolic diseases complicating pregnancy, first trimester: Secondary | ICD-10-CM

## 2019-09-22 DIAGNOSIS — Z3A11 11 weeks gestation of pregnancy: Secondary | ICD-10-CM

## 2019-09-22 DIAGNOSIS — E876 Hypokalemia: Secondary | ICD-10-CM | POA: Diagnosis not present

## 2019-09-22 LAB — URINALYSIS, COMPLETE (UACMP) WITH MICROSCOPIC
Bacteria, UA: NONE SEEN
Bilirubin Urine: NEGATIVE
Glucose, UA: NEGATIVE mg/dL
Hgb urine dipstick: NEGATIVE
Ketones, ur: 20 mg/dL — AB
Leukocytes,Ua: NEGATIVE
Nitrite: NEGATIVE
Protein, ur: NEGATIVE mg/dL
Specific Gravity, Urine: 1.021 (ref 1.005–1.030)
pH: 6 (ref 5.0–8.0)

## 2019-09-22 LAB — COMPREHENSIVE METABOLIC PANEL
ALT: 12 U/L (ref 0–44)
AST: 14 U/L — ABNORMAL LOW (ref 15–41)
Albumin: 3.9 g/dL (ref 3.5–5.0)
Alkaline Phosphatase: 30 U/L — ABNORMAL LOW (ref 38–126)
Anion gap: 8 (ref 5–15)
BUN: 11 mg/dL (ref 6–20)
CO2: 22 mmol/L (ref 22–32)
Calcium: 9.2 mg/dL (ref 8.9–10.3)
Chloride: 105 mmol/L (ref 98–111)
Creatinine, Ser: 0.57 mg/dL (ref 0.44–1.00)
GFR calc Af Amer: >60 mL/min (ref >60)
GFR calc non Af Amer: >60 mL/min (ref >60)
Glucose, Bld: 86 mg/dL (ref 70–99)
Potassium: 3.4 mmol/L — ABNORMAL LOW (ref 3.5–5.1)
Sodium: 135 mmol/L (ref 135–145)
Total Bilirubin: 0.4 mg/dL (ref 0.3–1.2)
Total Protein: 6.9 g/dL (ref 6.5–8.1)

## 2019-09-22 LAB — CBC
HCT: 35.3 % — ABNORMAL LOW (ref 36.0–46.0)
Hemoglobin: 11.9 g/dL — ABNORMAL LOW (ref 12.0–15.0)
MCH: 28.2 pg (ref 26.0–34.0)
MCHC: 33.7 g/dL (ref 30.0–36.0)
MCV: 83.6 fL (ref 80.0–100.0)
Platelets: 313 10*3/uL (ref 150–400)
RBC: 4.22 MIL/uL (ref 3.87–5.11)
RDW: 14.1 % (ref 11.5–15.5)
WBC: 6.8 10*3/uL (ref 4.0–10.5)
nRBC: 0 % (ref 0.0–0.2)

## 2019-09-22 MED ORDER — ONDANSETRON HCL 4 MG/2ML IJ SOLN
4.0000 mg | Freq: Four times a day (QID) | INTRAMUSCULAR | Status: DC
  Administered 2019-09-22 – 2019-09-23 (×3): 4 mg via INTRAVENOUS
  Filled 2019-09-22 (×3): qty 2

## 2019-09-22 MED ORDER — LACTATED RINGERS IV SOLN
INTRAVENOUS | Status: DC
  Administered 2019-09-22: 18:00:00 via INTRAVENOUS

## 2019-09-22 MED ORDER — ACETAMINOPHEN 325 MG PO TABS
650.0000 mg | ORAL_TABLET | ORAL | Status: DC | PRN
  Administered 2019-09-23 – 2019-09-24 (×3): 650 mg via ORAL
  Filled 2019-09-22 (×3): qty 2

## 2019-09-22 MED ORDER — PRENATAL MULTIVITAMIN CH: 1.0000 | ORAL_TABLET | Freq: Every day | ORAL | Status: DC

## 2019-09-22 MED ORDER — THIAMINE HCL 100 MG/ML IJ SOLN
Freq: Once | INTRAVENOUS | Status: AC
  Administered 2019-09-22: 22:00:00 via INTRAVENOUS
  Filled 2019-09-22: qty 1000

## 2019-09-22 MED ORDER — LACTATED RINGERS IV BOLUS
500.0000 mL | Freq: Once | INTRAVENOUS | Status: AC
  Administered 2019-09-22: 17:00:00 500 mL via INTRAVENOUS

## 2019-09-22 MED ORDER — DOCUSATE SODIUM 100 MG PO CAPS
100.0000 mg | ORAL_CAPSULE | Freq: Every day | ORAL | Status: DC
  Administered 2019-09-24: 11:00:00 100 mg via ORAL
  Filled 2019-09-22: qty 1

## 2019-09-22 MED ORDER — CALCIUM CARBONATE ANTACID 500 MG PO CHEW: 2.0000 | CHEWABLE_TABLET | ORAL | Status: DC | PRN

## 2019-09-22 NOTE — Progress Notes (Signed)
Pt arrived as direct admit for hyperemesis. Pt states she has had persistent N/V for a few weeks now- has lost 9 lbs. Last took Zofran Monday evening. VSS. IV team consulted for placement to initiate IVF. Rod Can CNM notified pt here.

## 2019-09-22 NOTE — Telephone Encounter (Signed)
Called patient to answer questions. She will be on her way to hospital in 30-45 minutes.

## 2019-09-22 NOTE — Telephone Encounter (Signed)
Patient is calling to speak with Opal Sidles. Patient has more questions and would like call back. Patient aware Opal Sidles is on call today.

## 2019-09-22 NOTE — Telephone Encounter (Signed)
What I told her is if she couldn't keep Anything down in 24 hours she may need IV fluids. I can direct admit her today and will call her to talk about it.

## 2019-09-22 NOTE — Telephone Encounter (Signed)
Patient following up on hosiptal for fluids phone call from yesterday. [phone]

## 2019-09-22 NOTE — H&P (Addendum)
OB History & Physical   History of Present Illness:  Chief Complaint: nausea and vomiting  HPI:  Tammy Jefferson is a 23 y.o. G1P0 female at [redacted]w[redacted]d dated by 9w ultrasound.  Her pregnancy has been complicated by hyperemesis gravidarum.    She has had nausea and vomiting for the past few weeks which has been worsening.  2 weeks ago she had emesis every other day. In the past week she has had emesis 5-6 times per day and essentially is keeping very little down. If she does keep something down she ends up vomiting about 30 minutes later. Anti-emetics have not been helping. She has been having trouble functioning at work.   Total weight gain for pregnancy: -4.536 kg   Obstetrical Problem List: pregnancy1 Problems (from 06/04/19 to present)    Problem Noted Resolved   Supervision of normal first pregnancy, antepartum 08/27/2019 by Rexene Agent, CNM No   Overview Signed 08/27/2019  2:41 PM by Rexene Agent, [place] [facility] Prenatal Labs  Dating  Blood type:     Genetic Screen 1 Screen:    AFP:     Quad:     NIPS: Antibody:   Anatomic [place]  Rubella:   Varicella:    GTT Early:               Third trimester:  RPR:     Rhogam  HBsAg:     TDaP vaccine                       Flu Shot: HIV:     Baby Food                                GBS:   Contraception  Pap:  CBB     CS/VBAC    Support Person                  Maternal Medical History:   Past Medical History:  Diagnosis Date  . Cyst (solitary) of breast     No past surgical history on file.  No Known Allergies  Prior to Admission medications   Medication Sig Start Date End Date Taking? Authorizing Provider  Cholecalciferol (VITAMIN D3) 1.25 MG (50000 UT) CAPS TK 1 C PO ONCE A WK 08/17/19   [provider]  ondansetron (ZOFRAN ODT) 4 MG disintegrating tablet Take 1 tablet (4 mg total) by mouth every 8 (eight) hours as needed for nausea or vomiting. 09/20/19   Rod Can, CNM  Prenatal Vit-Fe Fumarate-FA  (PRENATAL VITAMINS) 28-0.8 MG TABS TK 1 T PO ONCE A DAY 08/17/19   [provider]  promethazine (PHENERGAN) 25 MG tablet Take 1 tablet (25 mg total) by mouth every 6 (six) hours as needed for nausea or vomiting. 09/07/19   Rexene Agent, CNM    OB History  Gravida Para Term Preterm AB Living  1            SAB TAB Ectopic Multiple Live Births               # Outcome Date GA Lbr Len/2nd Weight Sex Delivery Anes PTL Lv  1 Current             Prenatal care site: [facility]  Social History: She  reports that she has never smoked. She has never used smokeless tobacco. She reports current  drug use. Drug: Marijuana. She reports that she does not drink alcohol.  Family History: family history includes Cancer (age of onset: 47) in her maternal grandmother; Cancer (age of onset: 58) in her maternal aunt.    Review of Systems:  Review of Systems  Constitutional: Positive for malaise/fatigue.  HENT: Negative.   Eyes: Negative.   Respiratory: Negative.   Cardiovascular: Negative.   Gastrointestinal: Positive for nausea and vomiting.  Genitourinary: Negative.   Musculoskeletal: Negative.   Skin: Negative.   Neurological: Negative.   Endo/Heme/Allergies: Negative.   Psychiatric/Behavioral: Negative.      Physical Exam:  BP 113/78 (BP Location: Left Arm)   Pulse 75   Temp 99.2 F (37.3 C) (Oral)   Resp 20   LMP 06/04/2019   SpO2 100%   Constitutional: well developed female in mild acute distress.  HEENT: normal Skin: Warm and dry.  Cardiovascular: Regular rate and rhythm.   Extremity: no edema  Respiratory: Clear to auscultation bilateral. Normal respiratory effort Abdomen: soft, nontender, nondistended, no abnormal masses, no epigastric pain Back: no CVAT Neuro: DTRs 2+, Cranial nerves grossly intact Psych: Alert and Oriented x3. No memory deficits. Normal mood and affect.   Results for Tammy Jefferson, Tammy Jefferson (MRN [id number]) as of 09/22/2019 19:21  Ref. Range  09/22/2019 16:56 09/22/2019 16:58 09/22/2019 18:04  COMPREHENSIVE METABOLIC PANEL Unknown   Rpt (A)  Sodium Latest Ref Range: 135 - 145 mmol/L   135  Potassium Latest Ref Range: 3.5 - 5.1 mmol/L   3.4 (L)  Chloride Latest Ref Range: 98 - 111 mmol/L   105  CO2 Latest Ref Range: 22 - 32 mmol/L   22  Glucose Latest Ref Range: 70 - 99 mg/dL   86  BUN Latest Ref Range: 6 - 20 mg/dL   11  Creatinine Latest Ref Range: 0.44 - 1.00 mg/dL   0.57  Calcium Latest Ref Range: 8.9 - 10.3 mg/dL   9.2  Anion gap Latest Ref Range: 5 - 15    8  Alkaline Phosphatase Latest Ref Range: 38 - 126 U/L   30 (L)  Albumin Latest Ref Range: 3.5 - 5.0 g/dL   3.9  AST Latest Ref Range: 15 - 41 U/L   14 (L)  ALT Latest Ref Range: 0 - 44 U/L   12  Total Protein Latest Ref Range: 6.5 - 8.1 g/dL   6.9  Total Bilirubin Latest Ref Range: 0.3 - 1.2 mg/dL   0.4  GFR, Est Non African American Latest Ref Range: >60 mL/min   >60  GFR, Est African American Latest Ref Range: >60 mL/min   >60  WBC Latest Ref Range: 4.0 - 10.5 K/uL   6.8  RBC Latest Ref Range: 3.87 - 5.11 MIL/uL   4.22  Hemoglobin Latest Ref Range: 12.0 - 15.0 g/dL   11.9 (L)  HCT Latest Ref Range: 36.0 - 46.0 %   35.3 (L)  MCV Latest Ref Range: 80.0 - 100.0 fL   83.6  MCH Latest Ref Range: 26.0 - 34.0 pg   28.2  MCHC Latest Ref Range: 30.0 - 36.0 g/dL   33.7  RDW Latest Ref Range: 11.5 - 15.5 %   14.1  Platelets Latest Ref Range: 150 - 400 K/uL   313  nRBC Latest Ref Range: 0.0 - 0.2 %   0.0  SARS CORONAVIRUS 2 (TAT 6-24 HRS) Unknown Rpt    Appearance Latest Ref Range: CLEAR   CLEAR (A)   Bilirubin Urine Latest Ref Range:  NEGATIVE   NEGATIVE   Color, Urine Latest Ref Range: YELLOW   YELLOW (A)   Glucose, UA Latest Ref Range: NEGATIVE mg/dL  NEGATIVE   Hgb urine dipstick Latest Ref Range: NEGATIVE   NEGATIVE   Ketones, ur Latest Ref Range: NEGATIVE mg/dL  20 (A)   Leukocytes,Ua Latest Ref Range: NEGATIVE   NEGATIVE   Nitrite Latest Ref Range: NEGATIVE    NEGATIVE   pH Latest Ref Range: 5.0 - 8.0   6.0   Protein Latest Ref Range: NEGATIVE mg/dL  NEGATIVE   Specific Gravity, Urine Latest Ref Range: 1.005 - 1.030   1.021   Bacteria, UA Latest Ref Range: NONE SEEN   NONE SEEN   Mucus Unknown  PRESENT   RBC / HPF Latest Ref Range: 0 - 5 RBC/hpf  0-5   Squamous Epithelial / LPF Latest Ref Range: 0 - 5   0-5   WBC, UA Latest Ref Range: 0 - 5 WBC/hpf  0-5      Pertinent Results:  Prenatal Labs Blood type/Rh O positive  Antibody screen negative  Rubella Immune  Varicella Not immune    RPR Non-reactive  HBsAg negative  HIV negative  GC negative  Chlamydia negative  Genetic screening declined     No results found for: SARSCOV2NAA] Pending  Assessment:  Tammy Jefferson is a 23 y.o. G1P0 female at [redacted]w[redacted]d with hyperemesis gravidarum, dehydration, mild hypokalemia.   Plan:  1. Admit to Antepartum 2. CBC, CMP, Urinalysis 3. LR bolus then switch to Banana bag with potassium chloride added  4. Fetal well being: doppler q 12 hours 5. Advance diet as tolerated   Rod Can, CNM 09/22/2019 7:28 PM

## 2019-09-23 ENCOUNTER — Other Ambulatory Visit: Payer: Self-pay

## 2019-09-23 DIAGNOSIS — O210 Mild hyperemesis gravidarum: Secondary | ICD-10-CM | POA: Diagnosis not present

## 2019-09-23 DIAGNOSIS — E876 Hypokalemia: Secondary | ICD-10-CM | POA: Diagnosis not present

## 2019-09-23 DIAGNOSIS — O99281 Endocrine, nutritional and metabolic diseases complicating pregnancy, first trimester: Secondary | ICD-10-CM | POA: Diagnosis not present

## 2019-09-23 DIAGNOSIS — Z3A11 11 weeks gestation of pregnancy: Secondary | ICD-10-CM | POA: Diagnosis not present

## 2019-09-23 LAB — POTASSIUM: Potassium: 4.2 mmol/L (ref 3.5–5.1)

## 2019-09-23 LAB — SARS CORONAVIRUS 2 (TAT 6-24 HRS): SARS Coronavirus 2: NEGATIVE

## 2019-09-23 MED ORDER — ONDANSETRON 4 MG PO TBDP
4.0000 mg | ORAL_TABLET | Freq: Four times a day (QID) | ORAL | Status: DC | PRN
  Administered 2019-09-23 – 2019-09-24 (×4): 4 mg via ORAL
  Filled 2019-09-23 (×4): qty 1

## 2019-09-23 MED ORDER — LACTATED RINGERS IV SOLN
INTRAVENOUS | Status: DC
  Administered 2019-09-23 – 2019-09-24 (×3): via INTRAVENOUS

## 2019-09-23 NOTE — Progress Notes (Signed)
Pt is feeling better today. Tolerated breakfast and PO zofran x1 but emesis x1 after eating spaghetti for lunch. Handout given on bland diet. Pt would like to try to eating dinner and taking PO zofran again this evening. Will continue to monitor.

## 2019-09-23 NOTE — Progress Notes (Addendum)
Daily Antepartum Note  Admission Date: 09/22/2019 Current Date: 09/23/2019  Tammy Jefferson is a 23 y.o. G1P0 @ [redacted]w[redacted]d admitted for hyperemesis.  Pregnancy complicated by:  Patient Active Problem List   Diagnosis Date Noted  . Hyperemesis complicating pregnancy, antepartum 09/22/2019  . Nausea and vomiting during pregnancy 09/08/2019  . Supervision of normal first pregnancy, antepartum 08/27/2019    Subjective:  Feeling better this morning, has been able to keep down crackers and some fluids. She is going to try to eat a regular breakfast with bland and easy to digest foods. Asking about how to manage work, as they are contacting her already about when she will be back.  Objective:   Vitals:   09/23/19 0811 09/23/19 0910  BP: 106/71 109/62  Pulse: 81 80  Resp:    Temp:    SpO2:     Temp:  [98.2 F (36.8 C)-99.2 F (37.3 C)] 99 F (37.2 C) (10/15 0805) Pulse Rate:  [72-81] 80 (10/15 0910) Resp:  [18-20] 20 (10/15 0805) BP: (98-113)/(52-78) 109/62 (10/15 0910) SpO2:  [98 %-100 %] 98 % (10/15 0805) Temp (24hrs), Avg:98.8 F (37.1 C), Min:98.2 F (36.8 C), Max:99.2 F (37.3 C)   Intake/Output Summary (Last 24 hours) at 09/23/2019 1138 Last data filed at 09/23/2019 0815 Gross per 24 hour  Intake 625 ml  Output 400 ml  Net 225 ml     Current Vital Signs 24h Vital Sign Ranges  T 99 F (37.2 C) Temp  Avg: 98.8 F (37.1 C)  Min: 98.2 F (36.8 C)  Max: 99.2 F (37.3 C)  BP 109/62 BP  Min: 98/52  Max: 113/78  HR 80 Pulse  Avg: 77.4  Min: 72  Max: 81  RR 20 Resp  Avg: 19.3  Min: 18  Max: 20  SaO2 98 % (room air) SpO2  Avg: 99 %  Min: 98 %  Max: 100 %       24 Hour I/O Current Shift I/O  Time Ins Outs 10/14 0701 - 10/15 0700 In: 625 [I.V.:125] Out: 100 [Urine:100] 10/15 0701 - 10/15 1900 In: -  Out: 300 [Urine:300]   Patient Vitals for the past 24 hrs:  BP Temp Temp src Pulse Resp SpO2  09/23/19 0910 109/62 - - 80 - -  09/23/19 0811 106/71 - - 81 - -   09/23/19 0805 - 99 F (37.2 C) Oral 79 20 98 %  09/22/19 2355 (!) 98/52 98.2 F (36.8 C) Oral 72 18 -  09/22/19 1545 113/78 99.2 F (37.3 C) Oral 75 20 100 %    Physical exam: General: No acute distress. Abdomen: Non-tender Cardiovascular: Regular rate Respiratory: No increased work of breathing Extremities: no clubbing, cyanosis or edema Skin: Warm and dry.   Medications: Current Facility-Administered Medications  Medication Dose Route Frequency Provider Last Rate Last Dose  . acetaminophen (TYLENOL) tablet 650 mg  650 mg Oral Q4H PRN Rod Can, CNM      . calcium carbonate (TUMS - dosed in mg elemental calcium) chewable tablet 400 mg of elemental calcium  2 tablet Oral Q4H PRN Rod Can, CNM      . docusate sodium (COLACE) capsule 100 mg  100 mg Oral Daily Rod Can, CNM      . lactated ringers infusion   Intravenous Continuous Rod Can, CNM 125 mL/hr at 09/23/19 0815    . ondansetron (ZOFRAN-ODT) disintegrating tablet 4 mg  4 mg Oral Q6H PRN Rexene Agent, CNM      .  prenatal multivitamin tablet 1 tablet  1 tablet Oral Q1200 Rod Can, CNM        Labs:  Recent Labs  Lab 09/22/19 1804  WBC 6.8  HGB 11.9*  HCT 35.3*  PLT 313    Recent Labs  Lab 09/22/19 1804 09/23/19 0835  NA 135  --   K 3.4* 4.2  CL 105  --   CO2 22  --   BUN 11  --   CREATININE 0.57  --   CALCIUM 9.2  --   PROT 6.9  --   BILITOT 0.4  --   ALKPHOS 30*  --   ALT 12  --   AST 14*  --   GLUCOSE 86  --      Assessment & Plan:  23 year old G1P0 with hyperemesis gravidarum.  Advance to regular diet. Continue IV hydration with LR at 125 ml/h; hypokalemia is resolved. Will change Zofran to PO dosing in anticipation of possible discharge later today. Fetal well-being reassuring, Doppler every shift. Discussed applying for intermittent FMLA at work.  Avel Sensor, CNM

## 2019-09-23 NOTE — Progress Notes (Signed)
Addendum to H&P   IV Zofran was ordered for nausea No patient complaints overnight  Rod Can, CNM

## 2019-09-24 DIAGNOSIS — O99281 Endocrine, nutritional and metabolic diseases complicating pregnancy, first trimester: Secondary | ICD-10-CM | POA: Diagnosis not present

## 2019-09-24 DIAGNOSIS — Z3A11 11 weeks gestation of pregnancy: Secondary | ICD-10-CM | POA: Diagnosis not present

## 2019-09-24 DIAGNOSIS — E876 Hypokalemia: Secondary | ICD-10-CM | POA: Diagnosis not present

## 2019-09-24 DIAGNOSIS — O210 Mild hyperemesis gravidarum: Secondary | ICD-10-CM | POA: Diagnosis not present

## 2019-09-24 MED ORDER — PANTOPRAZOLE SODIUM 40 MG PO TBEC: 40.0000 mg | DELAYED_RELEASE_TABLET | Freq: Every day | ORAL | 1 refills | Status: DC

## 2019-09-24 MED ORDER — BONJESTA 20-20 MG PO TBCR: 1.0000 | EXTENDED_RELEASE_TABLET | Freq: Two times a day (BID) | ORAL | 1 refills | Status: DC | PRN

## 2019-09-24 MED ORDER — SENNOSIDES-DOCUSATE SODIUM 8.6-50 MG PO TABS: ORAL_TABLET | ORAL | Status: DC

## 2019-09-24 MED ORDER — PANTOPRAZOLE SODIUM 40 MG PO TBEC
40.0000 mg | DELAYED_RELEASE_TABLET | Freq: Every day | ORAL | Status: DC
  Administered 2019-09-24: 40 mg via ORAL
  Filled 2019-09-24: qty 1

## 2019-09-24 NOTE — Progress Notes (Signed)
Patient discharged home with mother. Discharge instructions and prescriptions given and reviewed with patient. Patient verbalized understanding. Escorted out by staff.

## 2019-09-24 NOTE — Discharge Summary (Signed)
Physician Discharge Summary  Patient ID: Tammy Jefferson MRN: [id number] DOB/AGE: [birth year] 23 y.o.  Admit date: 09/22/2019 Discharge date: 09/24/2019  Admission Diagnoses:IUP at [place] with hyperemesis  Discharge Diagnoses:  Active Problems:   Hyperemesis complicating pregnancy, antepartum   Discharged Condition: improved  Hospital Course: Tammy Jefferson is a 23 y.o. G1P0 female at [redacted]w[redacted]d dated by 9w ultrasound.  Her pregnancy has been complicated by hyperemesis gravidarum.  In the week prior to her admission, she had emesis 5-6 times per day and was essentially keeping very little down. If she did keep something down she ended up vomiting about 30 minutes later. Anti-emetics were not helping. She has been having trouble functioning at work. She has lost 4.5 kg  She was admitted for IV hydration and IV antiemetics. She received Zofran for nausea initially intravenously then by mouth when she started to keep food and fluid down. She was also started on Protonix for reflux/heartburn with relief. On her day of discharge 10/16 she had not vomited and was keeping down solid food. She was discharged on a bland diet, Zofran , Protonix and a prescription was called in for [place].    Consults: None  Significant Diagnostic Studies:  Results for orders placed or performed during the hospital encounter of 09/22/19 (from the past 72 hour(s))  SARS CORONAVIRUS 2 (TAT 6-24 HRS) Nasopharyngeal Nasopharyngeal Swab     Status: None   Collection Time: 09/22/19  4:56 PM   Specimen: Nasopharyngeal Swab  Result Value Ref Range   SARS Coronavirus 2 NEGATIVE NEGATIVE    Comment: (NOTE) SARS-CoV-2 target nucleic acids are NOT DETECTED. The SARS-CoV-2 RNA is generally detectable in upper and lower respiratory specimens during the acute phase of infection. Negative results do not preclude SARS-CoV-2 infection, do not rule out co-infections with other pathogens, and should not be used as the sole  basis for treatment or other patient management decisions. Negative results must be combined with clinical observations, patient history, and epidemiological information. The expected result is Negative. Fact Sheet for Patients: SugarRoll.be Fact Sheet for Healthcare Providers: https://www.woods-mathews.com/ This test is not yet approved or cleared by the [place] FDA and  has been authorized for detection and/or diagnosis of SARS-CoV-2 by FDA under an Emergency Use Authorization (EUA). This EUA will remain  in effect (meaning this test can be used) for the duration of the COVID-19 declaration under Section 56 4(b)(1) of the Act, 21 U.S.C. section 360bbb-3(b)(1), unless the authorization is terminated or revoked sooner. Performed at [facility], [place] [address] [place], [place] [address]   Urinalysis, Complete w Microscopic     Status: Abnormal   Collection Time: 09/22/19  4:58 PM  Result Value Ref Range   Color, Urine YELLOW (A) YELLOW   APPearance CLEAR (A) CLEAR   Specific Gravity, Urine 1.021 1.005 - 1.030   pH 6.0 5.0 - 8.0   Glucose, UA NEGATIVE NEGATIVE mg/dL   Hgb urine dipstick NEGATIVE NEGATIVE   Bilirubin Urine NEGATIVE NEGATIVE   Ketones, ur 20 (A) NEGATIVE mg/dL   Protein, ur NEGATIVE NEGATIVE mg/dL   Nitrite NEGATIVE NEGATIVE   Leukocytes,Ua NEGATIVE NEGATIVE   RBC / HPF 0-5 0 - 5 RBC/hpf   WBC, UA 0-5 0 - 5 WBC/hpf   Bacteria, UA NONE SEEN NONE SEEN   Squamous Epithelial / LPF 0-5 0 - 5   Mucus PRESENT     Comment: Performed at [facility], [address], [place], [place] [address]  Comprehensive metabolic panel     Status: Abnormal   Collection Time: 09/22/19  6:04 PM  Result Value Ref Range   Sodium 135 135 - 145 mmol/L   Potassium 3.4 (L) 3.5 - 5.1 mmol/L   Chloride 105 98 - 111 mmol/L   CO2 22 22 - 32 mmol/L   Glucose, Bld 86 70 - 99 mg/dL   BUN 11 6 - 20 mg/dL   Creatinine, Ser 0.57  0.44 - 1.00 mg/dL   Calcium 9.2 8.9 - 10.3 mg/dL   Total Protein 6.9 6.5 - 8.1 g/dL   Albumin 3.9 3.5 - 5.0 g/dL   AST 14 (L) 15 - 41 U/L   ALT 12 0 - 44 U/L   Alkaline Phosphatase 30 (L) 38 - 126 U/L   Total Bilirubin 0.4 0.3 - 1.2 mg/dL   GFR calc non Af Amer >60 >60 mL/min   GFR calc Af Amer >60 >60 mL/min   Anion gap 8 5 - 15    Comment: Performed at [facility], [address] [place], [place] [address]  CBC on admission     Status: Abnormal   Collection Time: 09/22/19  6:04 PM  Result Value Ref Range   WBC 6.8 4.0 - 10.5 K/uL   RBC 4.22 3.87 - 5.11 MIL/uL   Hemoglobin 11.9 (L) 12.0 - 15.0 g/dL   HCT 35.3 (L) 36.0 - 46.0 %   MCV 83.6 80.0 - 100.0 fL   MCH 28.2 26.0 - 34.0 pg   MCHC 33.7 30.0 - 36.0 g/dL   RDW 14.1 11.5 - 15.5 %   Platelets 313 150 - 400 K/uL   nRBC 0.0 0.0 - 0.2 %    Comment: Performed at [facility], [address] [place], [place] [address]  Potassium     Status: None   Collection Time: 09/23/19  8:35 AM  Result Value Ref Range   Potassium 4.2 3.5 - 5.1 mmol/L    Comment: Performed at [facility], [address] [place], [place] [address]     Discharge Exam: Blood pressure (!) 96/56, pulse 80, temperature 98.8 F (37.1 C), temperature source Axillary, resp. rate 16, height 5\' 4"  (1.626 m), last menstrual period 06/04/2019, SpO2 100 %. General: alert, awake, speaking energetically Heart: regular rate Lungs: normal respiratory effort Abdomen: FHT 160, soft, NT Psyche: full affect, normal mood  Disposition: Discharge disposition: 01-Home or Self Care       Discharge Instructions    Discharge patient   Complete by: As directed    Discharge disposition: 01-Home or Self Care   Discharge patient date: 09/24/2019     Allergies as of 09/24/2019   No Known Allergies     Medication List    STOP taking these medications   promethazine 25 MG tablet Commonly known as: PHENERGAN     TAKE these medications    Bonjesta 20-20 MG Tbcr Generic drug: Doxylamine-Pyridoxine ER Take 1 tablet by mouth 2 (two) times daily as needed. Start by taking one tablet at bedtime, then add a second tablet if needed in the morning for nausea.   ondansetron 4 MG disintegrating tablet Commonly known as: Zofran ODT Take 1 tablet (4 mg total) by mouth every 8 (eight) hours as needed for nausea or vomiting.   pantoprazole 40 MG tablet Commonly known as: PROTONIX Take 1 tablet (40 mg total) by mouth daily. Start taking on: September 25, 2019   Prenatal Vitamins 28-0.8 MG Tabs TK 1 T  PO ONCE A DAY   senna-docusate 8.6-50 MG tablet Commonly known as: Senokot-S Take 2 tablets at hs daily  until has BM   Vitamin D3 1.25 MG (50000 UT) Caps TK 1 C PO ONCE A WK      Follow-up [place] Follow up.   Contact information: [place] [id number] [phone]       Dalia Heading, CNM. Go on 09/27/2019.   Specialty: Certified Nurse Midwife Why: at 1145 AM Contact information: [place] [address] [phone]           Signed: Dalia Heading 09/24/2019, 5:55 PM

## 2019-09-24 NOTE — Progress Notes (Signed)
Initial Nutrition Assessment  DOCUMENTATION CODES:   Not applicable  INTERVENTION:   Prenatal MVI daily   Can provide Ensure at patients request   NUTRITION DIAGNOSIS:   Inadequate oral intake related to other (see comment)(hyperemesis during pregnancy) as evidenced by per patient/family report.  GOAL:   Patient will meet greater than or equal to 90% of their needs  MONITOR:   PO intake, Labs, Weight trends, Skin, I & O's  REASON FOR ASSESSMENT:   Other (Comment)(stork report)    ASSESSMENT:   23 y.o. G1P0 female at [redacted]w[redacted]d with hyperemesis gravidarum, dehydration, mild hypokalemia.  RD working remotely.  Spoke with patient via phone. Pt reports nausea and vomiting for several weeks pta.  Pt reports that emesis started a few weeks ago at every other day but over the past week she has had emesis 5-6 times per day and essentially is keeping very little down. Pt reports that if she does keep something down she ends up vomiting about 30 minutes later. Pt has been drinking Ensure supplements at home and taking a gummy prenatal MVI daily. Pt reports her UBW is around 135-145lbs but reports that he has lost ~9lbs over the past few weeks. Pt reports improvement with zofran. Pt was able to keep breakfast and lunch down today. Pt declines Ensure at this time as she reports that she may go home today. Let patient know that we can provide Ensure in hospital if she would like it. Pt already ordered for daily prenatal multivitamin.   Medications reviewed and include: colace, protonix, MVI, LRS @75ml /hr  Labs reviewed: K 4.2 wnl  Unable to complete Nutrition-Focused physical exam at this time.   Diet Order:   Diet Order            Diet regular Room service appropriate? Yes; Fluid consistency: Thin  Diet effective now             EDUCATION NEEDS:   Education needs have been addressed  Skin:  Skin Assessment: Reviewed RN Assessment  Last BM:  10/14  Height:   Ht Readings  from Last 1 Encounters:  09/24/19 5\' 4"  (1.626 m)    Weight:   Wt Readings from Last 1 Encounters:  09/20/19 58.1 kg    Ideal Body Weight:  54.5 kg  BMI:  Body mass index is 21.97 kg/m.  Estimated Nutritional Needs:   Kcal:  1600-1800kcal/day  Protein:  80-90g/day  Fluid:  >1.6L/day  Koleen Distance MS, RD, LDN Pager #- [phone] Office#- [phone] After Hours Pager: [phone]

## 2019-09-24 NOTE — Progress Notes (Signed)
Progress Note Day of Admission: 09/22/2019 Today's Date: 09/24/2019   S: Feeling a little better today. Kept eggs and grits down this AM after po Zofran. Has a feeling in her throat like something is there. Vomited once yesterday after eating spaghettii for lunch.  Did not eat last night for supper.   O: General: alert, BF, sitting up in NAD  Vital Signs: BP 103/67 (BP Location: Right Arm)   Pulse 75   Temp 98.5 F (36.9 C) (Oral)   Resp 18   LMP 06/04/2019   SpO2 100%    Urine output 1100 yesterday and 900 ml so far today   Results for orders placed or performed during the hospital encounter of 09/22/19 (from the past 48 hour(s))  SARS CORONAVIRUS 2 (TAT 6-24 HRS) Nasopharyngeal Nasopharyngeal Swab     Status: None   Collection Time: 09/22/19  4:56 PM   Specimen: Nasopharyngeal Swab  Result Value Ref Range   SARS Coronavirus 2 NEGATIVE NEGATIVE    Comment: (NOTE) SARS-CoV-2 target nucleic acids are NOT DETECTED. The SARS-CoV-2 RNA is generally detectable in upper and lower respiratory specimens during the acute phase of infection. Negative results do not preclude SARS-CoV-2 infection, do not rule out co-infections with other pathogens, and should not be used as the sole basis for treatment or other patient management decisions. Negative results must be combined with clinical observations, patient history, and epidemiological information. The expected result is Negative. Fact Sheet for Patients: SugarRoll.be Fact Sheet for Healthcare Providers: https://www.woods-mathews.com/ This test is not yet approved or cleared by the [place] FDA and  has been authorized for detection and/or diagnosis of SARS-CoV-2 by FDA under an Emergency Use Authorization (EUA). This EUA will remain  in effect (meaning this test can be used) for the duration of the COVID-19 declaration under Section 56 4(b)(1) of the Act, 21 U.S.C. section  360bbb-3(b)(1), unless the authorization is terminated or revoked sooner. Performed at [facility], [place] [address] [place], [place] [address]   Urinalysis, Complete w Microscopic     Status: Abnormal   Collection Time: 09/22/19  4:58 PM  Result Value Ref Range   Color, Urine YELLOW (A) YELLOW   APPearance CLEAR (A) CLEAR   Specific Gravity, Urine 1.021 1.005 - 1.030   pH 6.0 5.0 - 8.0   Glucose, UA NEGATIVE NEGATIVE mg/dL   Hgb urine dipstick NEGATIVE NEGATIVE   Bilirubin Urine NEGATIVE NEGATIVE   Ketones, ur 20 (A) NEGATIVE mg/dL   Protein, ur NEGATIVE NEGATIVE mg/dL   Nitrite NEGATIVE NEGATIVE   Leukocytes,Ua NEGATIVE NEGATIVE   RBC / HPF 0-5 0 - 5 RBC/hpf   WBC, UA 0-5 0 - 5 WBC/hpf   Bacteria, UA NONE SEEN NONE SEEN   Squamous Epithelial / LPF 0-5 0 - 5   Mucus PRESENT     Comment: Performed at [facility], [address] [place], [place] [address]  Comprehensive metabolic panel     Status: Abnormal   Collection Time: 09/22/19  6:04 PM  Result Value Ref Range   Sodium 135 135 - 145 mmol/L   Potassium 3.4 (L) 3.5 - 5.1 mmol/L   Chloride 105 98 - 111 mmol/L   CO2 22 22 - 32 mmol/L   Glucose, Bld 86 70 - 99 mg/dL   BUN 11 6 - 20 mg/dL   Creatinine, Ser 0.57 0.44 - 1.00 mg/dL   Calcium 9.2 8.9 - 10.3 mg/dL   Total Protein 6.9 6.5 - 8.1 g/dL  Albumin 3.9 3.5 - 5.0 g/dL   AST 14 (L) 15 - 41 U/L   ALT 12 0 - 44 U/L   Alkaline Phosphatase 30 (L) 38 - 126 U/L   Total Bilirubin 0.4 0.3 - 1.2 mg/dL   GFR calc non Af Amer >60 >60 mL/min   GFR calc Af Amer >60 >60 mL/min   Anion gap 8 5 - 15    Comment: Performed at [facility], [address] [place], [place] [address]  CBC on admission     Status: Abnormal   Collection Time: 09/22/19  6:04 PM  Result Value Ref Range   WBC 6.8 4.0 - 10.5 K/uL   RBC 4.22 3.87 - 5.11 MIL/uL   Hemoglobin 11.9 (L) 12.0 - 15.0 g/dL   HCT 35.3 (L) 36.0 - 46.0 %   MCV 83.6 80.0 - 100.0 fL   MCH 28.2 26.0 - 34.0  pg   MCHC 33.7 30.0 - 36.0 g/dL   RDW 14.1 11.5 - 15.5 %   Platelets 313 150 - 400 K/uL   nRBC 0.0 0.0 - 0.2 %    Comment: Performed at [facility], [address], [place], [place] [address]  Potassium     Status: None   Collection Time: 09/23/19  8:35 AM  Result Value Ref Range   Potassium 4.2 3.5 - 5.1 mmol/L    Comment: Performed at [facility], [address] [place], [place] [address]    A: IUP at 12wk1d with hyperemesis-improving on Zofran May be having some reflux  P: Start Protonix po Check after lunch to see how tolerated and to evaluate for discharge.  Dalia Heading, CNM

## 2019-09-24 NOTE — Discharge Instructions (Signed)
Hyperemesis Gravidarum Hyperemesis gravidarum is a severe form of nausea and vomiting that happens during pregnancy. Hyperemesis is worse than morning sickness. It may cause you to have nausea or vomiting all day for many days. It may keep you from eating and drinking enough food and liquids, which can lead to dehydration, malnutrition, and weight loss. Hyperemesis usually occurs during the first half (the first 20 weeks) of pregnancy. It often goes away once a woman is in her second half of pregnancy. However, sometimes hyperemesis continues through an entire pregnancy. What are the causes? The cause of this condition is not known. It may be related to changes in chemicals (hormones) in the body during pregnancy, such as the high level of pregnancy hormone (human chorionic gonadotropin) or the increase in the female sex hormone (estrogen). What are the signs or symptoms? Symptoms of this condition include:  Nausea that does not go away.  Vomiting that does not allow you to keep any food down.  Weight loss.  Body fluid loss (dehydration).  Having no desire to eat, or not liking food that you have previously enjoyed. How is this diagnosed? This condition may be diagnosed based on:  A physical exam.  Your medical history.  Your symptoms.  Blood tests.  Urine tests. How is this treated? This condition is managed by controlling symptoms. This may include:  Following an eating plan. This can help lessen nausea and vomiting.  Taking prescription medicines. An eating plan and medicines are often used together to help control symptoms. If medicines do not help relieve nausea and vomiting, you may need to receive fluids through an IV at the hospital. Follow these instructions at home: Eating and drinking   Avoid the following: ? Drinking fluids with meals. Try not to drink anything during the 30 minutes before and after your meals. ? Drinking more than 1 cup of fluid at a  time. ? Eating foods that trigger your symptoms. These may include spicy foods, coffee, high-fat foods, very sweet foods, and acidic foods. ? Skipping meals. Nausea can be more intense on an empty stomach. If you cannot tolerate food, do not force it. Try sucking on ice chips or other frozen items and make up for missed calories later. ? Lying down within 2 hours after eating. ? Being exposed to environmental triggers. These may include food smells, smoky rooms, closed spaces, rooms with strong smells, warm or humid places, overly loud and noisy rooms, and rooms with motion or flickering lights. Try eating meals in a well-ventilated area that is free of strong smells. ? Quick and sudden changes in your movement. ? Taking iron pills and multivitamins that contain iron. If you take prescription iron pills, do not stop taking them unless your health care provider approves. ? Preparing food. The smell of food can spoil your appetite or trigger nausea.  To help relieve your symptoms: ? Listen to your body. Everyone is different and has different preferences. Find what works best for you. ? Eat and drink slowly. ? Eat 5-6 small meals daily instead of 3 large meals. Eating small meals and snacks can help you avoid an empty stomach. ? In the morning, before getting out of bed, eat a couple of crackers to avoid moving around on an empty stomach. ? Try eating starchy foods as these are usually tolerated well. Examples include cereal, toast, bread, potatoes, pasta, rice, and pretzels. ? Include at least 1 serving of protein with your meals and snacks. Protein options include  lean meats, poultry, seafood, beans, nuts, nut butters, eggs, cheese, and yogurt. °? Try eating a protein-rich snack before bed. Examples of a protein-rick snack include cheese and crackers or a peanut butter sandwich made with 1 slice of whole-wheat bread and 1 tsp (5 g) of peanut butter. °? Eat or suck on things that have ginger in them.  It may help relieve nausea. Add ¼ tsp ground ginger to hot tea or choose ginger tea. °? Try drinking 100% fruit juice or an electrolyte drink. An electrolyte drink contains sodium, potassium, and chloride. °? Drink fluids that are cold, clear, and carbonated or sour. Examples include lemonade, ginger ale, lemon-lime soda, ice water, and sparkling water. °? Brush your teeth or use a mouth rinse after meals. °? Talk with your health care provider about starting a supplement of vitamin B6. °General instructions °· Take over-the-counter and prescription medicines only as told by your health care provider. °· Follow instructions from your health care provider about eating or drinking restrictions. °· Continue to take your prenatal vitamins as told by your health care provider. If you are having trouble taking your prenatal vitamins, talk with your health care provider about different options. °· Keep all follow-up and pre-birth (prenatal) visits as told by your health care provider. This is important. °Contact a health care provider if: °· You have pain in your abdomen. °· You have a severe headache. °· You have vision problems. °· You are losing weight. °· You feel weak or dizzy. °Get help right away if: °· You cannot drink fluids without vomiting. °· You vomit blood. °· You have constant nausea and vomiting. °· You are very weak. °· You faint. °· You have a fever and your symptoms suddenly get worse. °Summary °· Hyperemesis gravidarum is a severe form of nausea and vomiting that happens during pregnancy. °· Making some changes to your eating habits may help relieve nausea and vomiting. °· This condition may be managed with medicine. °· If medicines do not help relieve nausea and vomiting, you may need to receive fluids through an IV at the hospital. °This information is not intended to replace advice given to you by your health care provider. Make sure you discuss any questions you have with your health care  provider. °Document Released: 11/25/2005 Document Revised: 12/15/2017 Document Reviewed: 07/24/2016 °Elsevier Patient Education © 2020 [employer]vier Inc. ° ° °

## 2019-09-27 ENCOUNTER — Ambulatory Visit (INDEPENDENT_AMBULATORY_CARE_PROVIDER_SITE_OTHER): Payer: BC Managed Care – PPO | Admitting: Certified Nurse Midwife

## 2019-09-27 ENCOUNTER — Other Ambulatory Visit: Payer: Self-pay

## 2019-09-27 VITALS — BP 110/50 | Wt 129.0 lb

## 2019-09-27 DIAGNOSIS — O210 Mild hyperemesis gravidarum: Secondary | ICD-10-CM

## 2019-09-27 DIAGNOSIS — Z3400 Encounter for supervision of normal first pregnancy, unspecified trimester: Secondary | ICD-10-CM

## 2019-09-27 DIAGNOSIS — Z3A12 12 weeks gestation of pregnancy: Secondary | ICD-10-CM

## 2019-09-27 LAB — POCT URINALYSIS DIPSTICK OB
Glucose, UA: NEGATIVE
Ketones, UA: NEGATIVE

## 2019-09-27 NOTE — Progress Notes (Signed)
C/o had a good day Saturday; a little nauseas Sunday; doesn't feel good today at all; bad right side back pain ?from hospital bed.rj

## 2019-09-27 NOTE — Progress Notes (Signed)
Follow up to hospitalization for hyperemesis 10/14 to 10/16. Did not vomit 10/17 and 10/17 and had been keeping down soup, scrambled eggs, water, but vomited this AM (bile) before she took her Protonix and Zofan this AM. [facility] contacted her regarding Bonjesta prescription. Given Bonjesta samples today with instructions for use. Weight up 1# from last visit. Complains of pain in right SI area, worse with movement and some pain in right ligament area. Significant other has been massaging back. Discussed stretching exercises, use of Biofreeze, heating pad. FHTs WNL today No ketones in urine this morning ROB in 1 week Continue bland diet. Given suggestions for food (apple, peanut butter) Continue Protonix and Zofran, and start Bonjesta.  Dalia Heading, CNM

## 2019-10-05 ENCOUNTER — Encounter: Payer: BC Managed Care – PPO | Admitting: Maternal Newborn

## 2019-10-05 ENCOUNTER — Other Ambulatory Visit: Payer: Self-pay

## 2019-10-05 ENCOUNTER — Ambulatory Visit (INDEPENDENT_AMBULATORY_CARE_PROVIDER_SITE_OTHER): Payer: BC Managed Care – PPO | Admitting: Certified Nurse Midwife

## 2019-10-05 VITALS — BP 110/60 | Wt 132.0 lb

## 2019-10-05 DIAGNOSIS — M549 Dorsalgia, unspecified: Secondary | ICD-10-CM

## 2019-10-05 DIAGNOSIS — Z3400 Encounter for supervision of normal first pregnancy, unspecified trimester: Secondary | ICD-10-CM

## 2019-10-05 DIAGNOSIS — Z3A13 13 weeks gestation of pregnancy: Secondary | ICD-10-CM

## 2019-10-05 DIAGNOSIS — O99891 Other specified diseases and conditions complicating pregnancy: Secondary | ICD-10-CM

## 2019-10-05 DIAGNOSIS — O210 Mild hyperemesis gravidarum: Secondary | ICD-10-CM

## 2019-10-05 LAB — POCT URINALYSIS DIPSTICK
Bilirubin, UA: NEGATIVE
Blood, UA: NEGATIVE
Glucose, UA: NEGATIVE
Leukocytes, UA: NEGATIVE
Nitrite, UA: NEGATIVE
Protein, UA: POSITIVE — AB
Spec Grav, UA: >=1.03 — AB (ref 1.010–1.025)
Urobilinogen, UA: NEGATIVE E.U./dL — AB
pH, UA: 5 (ref 5.0–8.0)

## 2019-10-05 LAB — POCT URINALYSIS DIPSTICK OB: Glucose, UA: NEGATIVE

## 2019-10-05 NOTE — Progress Notes (Signed)
C/o still having pretty bad back pain

## 2019-10-07 ENCOUNTER — Telehealth: Payer: Self-pay

## 2019-10-07 LAB — URINE CULTURE: Organism ID, Bacteria: NO GROWTH

## 2019-10-07 NOTE — Progress Notes (Signed)
ROB/ Follow up on hyperemesis at 13wk5d: Doing better overall. Taking Bonjesta and Protonix as needed.  Has gained 3# in the past week. Just returned to work this week. Did not do well at work, due to odors and low back pain when working 8-9 hours. Has had right sacroiliac pain, but the pain is all across sacral area and has some shooting pains down her legs. No dysuria.  Urine dipstick: trace ketones, sp grav 1.030, no blood, no nitrites, no leukocytes FHT 161 A: Probable MSK back pain Hyperemesis-improved P: Note given for work to limit hours to 5 hrs/day Discussed relief measures for back pain RTO in 2 weeks. Consider PT consult if back pain persists Urine culture  Dalia Heading, CNM

## 2019-10-07 NOTE — Telephone Encounter (Signed)
FMLA/DISABILITY form for [employer] filled out, signature obtained and given to KT for processing. 

## 2019-10-14 ENCOUNTER — Telehealth: Payer: Self-pay

## 2019-10-14 NOTE — Telephone Encounter (Signed)
Another FMLA/DISABILITY form for [employer] filled out, signature obtained and given to KT for processing.

## 2019-10-14 NOTE — Telephone Encounter (Signed)
FMLA/DISABILITY form for [employer] filled out, signature obtained and given to KT for processing. 

## 2019-10-19 ENCOUNTER — Other Ambulatory Visit: Payer: Self-pay

## 2019-10-19 ENCOUNTER — Ambulatory Visit (INDEPENDENT_AMBULATORY_CARE_PROVIDER_SITE_OTHER): Payer: BC Managed Care – PPO | Admitting: Certified Nurse Midwife

## 2019-10-19 VITALS — BP 100/60 | Wt 136.0 lb

## 2019-10-19 DIAGNOSIS — Z1379 Encounter for other screening for genetic and chromosomal anomalies: Secondary | ICD-10-CM

## 2019-10-19 DIAGNOSIS — Z363 Encounter for antenatal screening for malformations: Secondary | ICD-10-CM

## 2019-10-19 DIAGNOSIS — O26892 Other specified pregnancy related conditions, second trimester: Secondary | ICD-10-CM

## 2019-10-19 DIAGNOSIS — Z3A15 15 weeks gestation of pregnancy: Secondary | ICD-10-CM

## 2019-10-19 DIAGNOSIS — M549 Dorsalgia, unspecified: Secondary | ICD-10-CM

## 2019-10-19 DIAGNOSIS — Z3400 Encounter for supervision of normal first pregnancy, unspecified trimester: Secondary | ICD-10-CM

## 2019-10-19 NOTE — Progress Notes (Signed)
C/o still back pain; waking up c H/A - e.s. tylenol not completely taking care of it; starting to feel pressure.rj

## 2019-10-21 LAB — AFP, SERUM, OPEN SPINA BIFIDA
AFP MoM: 0.99
AFP Value: 36.9 ng/mL
Gest. Age on Collection Date: 15.7 weeks
Maternal Age At EDD: 24.5 yr
OSBR Risk 1 IN: 10000
Test Results:: NEGATIVE
Weight: 136 [lb_av]

## 2019-10-24 LAB — MATERNIT 21 PLUS CORE, BLOOD
Fetal Fraction: 6
Result (T21): NEGATIVE
Trisomy 13 (Patau syndrome): NEGATIVE
Trisomy 18 (Edwards syndrome): NEGATIVE
Trisomy 21 (Down syndrome): NEGATIVE

## 2019-10-24 NOTE — Progress Notes (Signed)
HROB at 15wk5d: Nausea and vomiting improved. Taking Bonjesta and Protonix as needed. Weight up 4# Starting to show and feeling increased pressure from growth.  Last day of work is tomorrow. Complains of right SI pain and sometimes right lateral leg pain when walking or turning Discussed relief measures for MSK pain FHTs WNL Desires MaterniT 21 testing and MSAFP testing today Will call with results  ROB and anatomy scan  In 4 weeks.  Dalia Heading, CNM

## 2019-11-16 ENCOUNTER — Ambulatory Visit (INDEPENDENT_AMBULATORY_CARE_PROVIDER_SITE_OTHER): Payer: BC Managed Care – PPO

## 2019-11-16 ENCOUNTER — Encounter: Payer: Self-pay | Admitting: Advanced Practice Midwife

## 2019-11-16 ENCOUNTER — Ambulatory Visit (INDEPENDENT_AMBULATORY_CARE_PROVIDER_SITE_OTHER): Payer: BC Managed Care – PPO | Admitting: Advanced Practice Midwife

## 2019-11-16 ENCOUNTER — Other Ambulatory Visit: Payer: Self-pay

## 2019-11-16 VITALS — BP 104/74 | Wt 145.0 lb

## 2019-11-16 DIAGNOSIS — Z3402 Encounter for supervision of normal first pregnancy, second trimester: Secondary | ICD-10-CM

## 2019-11-16 DIAGNOSIS — Z3A19 19 weeks gestation of pregnancy: Secondary | ICD-10-CM

## 2019-11-16 DIAGNOSIS — Z363 Encounter for antenatal screening for malformations: Secondary | ICD-10-CM

## 2019-11-16 DIAGNOSIS — Z3400 Encounter for supervision of normal first pregnancy, unspecified trimester: Secondary | ICD-10-CM

## 2019-11-16 NOTE — Progress Notes (Signed)
U/s today. No vb. No lof.  

## 2019-11-16 NOTE — Progress Notes (Signed)
Routine Prenatal Care Visit  Subjective  Tammy Jefferson is a 23 y.o. G1P0 at [redacted]w[redacted]d being seen today for ongoing prenatal care.  She is currently monitored for the following issues for this low-risk pregnancy and has Supervision of normal first pregnancy, antepartum; Nausea and vomiting during pregnancy; and Hyperemesis complicating pregnancy, antepartum on their problem list.  ----------------------------------------------------------------------------------- Patient reports back pain, headaches.   Contractions: Not present. Vag. Bleeding: None.  Movement: Present. Leaking Fluid denies.  ----------------------------------------------------------------------------------- The following portions of the patient's history were reviewed and updated as appropriate: allergies, current medications, past family history, past medical history, past social history, past surgical history and problem list. Problem list updated.  Objective  Blood pressure 104/74, weight 145 lb (65.8 kg), last menstrual period 06/04/2019. Pregravid weight 138 lb (62.6 kg) Total Weight Gain 7 lb (3.175 kg) Urinalysis: Urine Protein    Urine Glucose    Fetal Status: Fetal Heart Rate (bpm): 147 Fundal Height: 20 cm Movement: Present      Patient Name: Tammy Jefferson DOB: 15-Nov-1996 MRN: [id number] ULTRASOUND REPORT  Location: [facility] Date of Service: 11/16/2019   Indications:Anatomy Ultrasound Findings:  Nelda [place]se[place] intrauterine pregnancy is visualized with FHR at 147 BPM.   Biometrics give an (U/S) Gestational age of [redacted]w[redacted]d and an (U/S) EDD of 04/10/20; this correlates with the clinically established Estimated Date of Delivery: 04/06/20   Fetal presentation is Breech.  EFW: 285g (10oz). Placenta: posterior. Grade: 1. Cervix: 3.3cm AFI: subjectively normal.  Anatomic survey is complete and normal; Gender - female.    Right Ovary: is normal in appearance. Left Ovary: Not seen.  Survey of the adnexa  demonstrates no adnexal masses.  There is no free peritoneal fluid in the cul de sac.  Impression: 1. [redacted]w[redacted]d Viable Singleton Intrauterine pregnancy by U/S. 2. (U/S) EDD is consistent with Clinically established Estimated Date of Delivery: 04/06/20 . 3. Normal Anatomy Scan  Vita Barley, RT   General:  Alert, oriented and cooperative. Patient is in no acute distress.  Skin: Skin is warm and dry. No rash noted.   Cardiovascular: Normal heart rate noted  Respiratory: Normal respiratory effort, no problems with respiration noted  Abdomen: Soft, gravid, appropriate for gestational age. Pain/Pressure: Present     Pelvic:  Cervical exam deferred        Extremities: Normal range of motion.  Edema: None  Mental Status: Normal mood and affect. Normal behavior. Normal judgment and thought content.   Assessment   23 y.o. G1P0 at [redacted]w[redacted]d by  04/06/2020, by Ultrasound presenting for routine prenatal visit  Plan   pregnancy1 Problems (from 06/04/19 to present)    Problem Noted Resolved   Supervision of normal first pregnancy, antepartum 08/27/2019 by Rexene Agent, CNM No   Overview Addendum 10/25/2019  1:38 PM by Dalia Heading, [place] Prenatal Labs  Dating 9wk5d ultrasound Blood type: O/Positive/-- (09/18 1537)   Genetic Screen  MSAFP:  neg  NIPS: diploid XX Antibody:Negative (09/18 1537)  Anatomic [place]  Rubella: 17.40 (09/18 1537) Varicella:  Nonimmune  GTT Early:               Third trimester:  RPR: Non Reactive (09/18 1537)   Rhogam  HBsAg: Negative (09/18 1537)   TDaP vaccine                       Flu Shot: HIV: Non Reactive (09/18 1537)   Baby Food  GBS:   Contraception  Pap:  CBB     CS/VBAC    Support Person                  Preterm labor symptoms and general obstetric precautions including but not limited to vaginal bleeding, contractions, leaking of fluid and fetal movement were reviewed in detail with the patient.   Back pain: abdominal support, heat/ice, stretches/yoga, topical pain relief, epsom salt soaks Headache: increase protein, OTC magnesium and riboflavin, stay well hydrated, adequate sleep  Given cervical length by abdominal u/s will repeat with transvaginal in 2 weeks Return in about 2 weeks (around 11/30/2019) for cervical length u/s and rob.  Rod Can, CNM 11/16/2019 3:42 PM

## 2019-12-01 ENCOUNTER — Ambulatory Visit (INDEPENDENT_AMBULATORY_CARE_PROVIDER_SITE_OTHER): Payer: BC Managed Care – PPO

## 2019-12-01 ENCOUNTER — Encounter: Payer: Self-pay | Admitting: Advanced Practice Midwife

## 2019-12-01 ENCOUNTER — Ambulatory Visit (INDEPENDENT_AMBULATORY_CARE_PROVIDER_SITE_OTHER): Payer: BC Managed Care – PPO | Admitting: Advanced Practice Midwife

## 2019-12-01 ENCOUNTER — Other Ambulatory Visit: Payer: Self-pay

## 2019-12-01 VITALS — BP 114/70 | Wt 152.0 lb

## 2019-12-01 DIAGNOSIS — Z3689 Encounter for other specified antenatal screening: Secondary | ICD-10-CM | POA: Diagnosis not present

## 2019-12-01 DIAGNOSIS — Z3A21 21 weeks gestation of pregnancy: Secondary | ICD-10-CM

## 2019-12-01 DIAGNOSIS — Z3400 Encounter for supervision of normal first pregnancy, unspecified trimester: Secondary | ICD-10-CM

## 2019-12-01 NOTE — Progress Notes (Signed)
  Routine Prenatal Care Visit  Subjective  Tammy Jefferson is a 23 y.o. G1P0 at [redacted]w[redacted]d being seen today for ongoing prenatal care.  She is currently monitored for the following issues for this low-risk pregnancy and has Supervision of normal first pregnancy, antepartum; Nausea and vomiting during pregnancy; and Hyperemesis complicating pregnancy, antepartum on their problem list.  ----------------------------------------------------------------------------------- Patient reports no complaints.    . Vag. Bleeding: None.  Movement: Present. Leaking Fluid denies.  ----------------------------------------------------------------------------------- The following portions of the patient's history were reviewed and updated as appropriate: allergies, current medications, past family history, past medical history, past social history, past surgical history and problem list. Problem list updated.  Objective  Blood pressure 114/70, weight 152 lb (68.9 kg), last menstrual period 06/04/2019. Pregravid weight 138 lb (62.6 kg) Total Weight Gain 14 lb (6.35 kg) Urinalysis: Urine Protein    Urine Glucose    Fetal Status: Fetal Heart Rate (bpm): 149 Fundal Height: 22 cm Movement: Present      Cervical length ultrasound: 4.2 cm  General:  Alert, oriented and cooperative. Patient is in no acute distress.  Skin: Skin is warm and dry. No rash noted.   Cardiovascular: Normal heart rate noted  Respiratory: Normal respiratory effort, no problems with respiration noted  Abdomen: Soft, gravid, appropriate for gestational age. Pain/Pressure: Absent     Pelvic:  Cervical exam deferred        Extremities: Normal range of motion.     Mental Status: Normal mood and affect. Normal behavior. Normal judgment and thought content.   Assessment   23 y.o. G1P0 at [redacted]w[redacted]d by  04/06/2020, by Ultrasound presenting for routine prenatal visit  Plan   pregnancy1 Problems (from 06/04/19 to present)    Problem Noted Resolved   Supervision of normal first pregnancy, antepartum 08/27/2019 by Rexene Agent, CNM No   Overview Addendum 10/25/2019  1:38 PM by Dalia Heading, [place] Prenatal Labs  Dating 9wk5d ultrasound Blood type: O/Positive/-- (09/18 1537)   Genetic Screen  MSAFP:  neg  NIPS: diploid XX Antibody:Negative (09/18 1537)  Anatomic [place]  Rubella: 17.40 (09/18 1537) Varicella:  Nonimmune  GTT Early:               Third trimester:  RPR: Non Reactive (09/18 1537)   Rhogam  HBsAg: Negative (09/18 1537)   TDaP vaccine                       Flu Shot: HIV: Non Reactive (09/18 1537)   Baby Food                                GBS:   Contraception  Pap:  CBB     CS/VBAC    Support Person                  Preterm labor symptoms and general obstetric precautions including but not limited to vaginal bleeding, contractions, leaking of fluid and fetal movement were reviewed in detail with the patient.    Return in about 4 weeks (around 12/29/2019) for telephone rob.  Rod Can, CNM 12/01/2019 11:44 AM

## 2019-12-01 NOTE — Progress Notes (Signed)
Cervical length ultrasound  today. No vb. No lof.

## 2019-12-10 NOTE — L&D Delivery Note (Signed)
Delivery Note At 9:11 PM a viable child was delivered via Vaginal, Spontaneous (Presentation: Right Occiput Anterior).  APGAR: 8, 9; weight pending.   Placenta status: Spontaneous, Intact.  Cord: 3 vessels with the following complications: None.  Cord pH: N/A  Anesthesia: Epidural Episiotomy: None Lacerations: none Suture Repair: N/A Est. Blood Loss (mL):  347mL  Mom to postpartum.  Baby to Couplet care / Skin to Skin.  Malachy Mood 03/26/2020, 9:37 PM

## 2019-12-29 ENCOUNTER — Ambulatory Visit (INDEPENDENT_AMBULATORY_CARE_PROVIDER_SITE_OTHER): Payer: BC Managed Care – PPO | Admitting: Advanced Practice Midwife

## 2019-12-29 ENCOUNTER — Encounter: Payer: Self-pay | Admitting: Advanced Practice Midwife

## 2019-12-29 ENCOUNTER — Other Ambulatory Visit: Payer: Self-pay

## 2019-12-29 VITALS — Wt 159.0 lb

## 2019-12-29 DIAGNOSIS — Z3A25 25 weeks gestation of pregnancy: Secondary | ICD-10-CM

## 2019-12-29 DIAGNOSIS — Z3400 Encounter for supervision of normal first pregnancy, unspecified trimester: Secondary | ICD-10-CM

## 2019-12-29 DIAGNOSIS — Z3402 Encounter for supervision of normal first pregnancy, second trimester: Secondary | ICD-10-CM

## 2019-12-29 DIAGNOSIS — Z131 Encounter for screening for diabetes mellitus: Secondary | ICD-10-CM

## 2019-12-29 DIAGNOSIS — Z130 Encounter for screening for diseases of the blood and blood-forming organs and certain disorders involving the immune mechanism: Secondary | ICD-10-CM

## 2019-12-29 DIAGNOSIS — Z113 Encounter for screening for infections with a predominantly sexual mode of transmission: Secondary | ICD-10-CM

## 2019-12-29 NOTE — Progress Notes (Signed)
Phone visit today

## 2019-12-29 NOTE — Progress Notes (Signed)
Routine Prenatal Care Visit- Virtual Visit  Subjective   Virtual Visit via Telephone Note  I connected with Tammy Jefferson on 12/29/19 at  1:58 PM EST by telephone and verified that I am speaking with the correct person using two identifiers.   I discussed the limitations, risks, security and privacy concerns of performing an evaluation and management service by telephone and the availability of in person appointments. I also discussed with the patient that there may be a patient responsible charge related to this service. The patient expressed understanding and agreed to proceed.  The patient was at home I spoke with the patient from my  Office phone The names of people involved in this encounter were: [facility] and myself Rod Can, CNM   Tammy Jefferson is a 24 y.o. G1P0 at [redacted]w[redacted]d being seen today for ongoing prenatal care.  She is currently monitored for the following issues for this low-risk pregnancy and has Supervision of normal first pregnancy, antepartum; Nausea and vomiting during pregnancy; and Hyperemesis complicating pregnancy, antepartum on their problem list.  ----------------------------------------------------------------------------------- Patient reports good fetal movement.  She has ongoing back pain. Her abdominal support band helps some. Contractions: Not present. Vag. Bleeding: None.  Movement: Present. Denies leaking of fluid.  ----------------------------------------------------------------------------------- The following portions of the patient's history were reviewed and updated as appropriate: allergies, current medications, past family history, past medical history, past social history, past surgical history and problem list. Problem list updated.   Objective  Weight 159 lb (72.1 kg), last menstrual period 06/04/2019. Pregravid weight 138 lb (62.6 kg) Total Weight Gain 21 lb (9.526 kg) Urinalysis:      Fetal Status:     Movement: Present      Physical Exam could not be performed. Because of the COVID-19 outbreak this visit was performed over the phone and not in person.   Assessment   24 y.o. G1P0 at [redacted]w[redacted]d by  04/06/2020, by Ultrasound presenting for routine prenatal visit  Plan   pregnancy1 Problems (from 06/04/19 to present)    Problem Noted Resolved   Supervision of normal first pregnancy, antepartum 08/27/2019 by Rexene Agent, CNM No   Overview Addendum 10/25/2019  1:38 PM by Dalia Heading, [place] Prenatal Labs  Dating 9wk5d ultrasound Blood type: O/Positive/-- (09/18 1537)   Genetic Screen  MSAFP:  neg  NIPS: diploid XX Antibody:Negative (09/18 1537)  Anatomic [place]  Rubella: 17.40 (09/18 1537) Varicella:  Nonimmune  GTT Early:               Third trimester:  RPR: Non Reactive (09/18 1537)   Rhogam  HBsAg: Negative (09/18 1537)   TDaP vaccine                       Flu Shot: HIV: Non Reactive (09/18 1537)   Baby Food                                GBS:   Contraception  Pap:  CBB     CS/VBAC    Support Person                  Gestational age appropriate obstetric precautions including but not limited to vaginal bleeding, contractions, leaking of fluid and fetal movement were reviewed in detail with the patient.     Follow Up Instructions: Continue healthy prenatal activities Abdominal support band  Stretching exercises   I discussed the assessment and treatment plan with the patient. The patient was provided an opportunity to ask questions and all were answered. The patient agreed with the plan and demonstrated an understanding of the instructions.   The patient was advised to call back or seek an in-person evaluation if the symptoms worsen or if the condition fails to improve as anticipated.  I provided 10 minutes of non-face-to-face time during this encounter.  Return in about 19 days (around 01/17/2020) for 28 wk labs and rob.   Rod Can, [facility] 12/29/2019, 2:08 PM

## 2020-01-17 ENCOUNTER — Other Ambulatory Visit: Payer: Self-pay

## 2020-01-17 ENCOUNTER — Other Ambulatory Visit: Payer: BC Managed Care – PPO

## 2020-01-17 ENCOUNTER — Ambulatory Visit (INDEPENDENT_AMBULATORY_CARE_PROVIDER_SITE_OTHER): Payer: BC Managed Care – PPO | Admitting: Obstetrics and Gynecology

## 2020-01-17 VITALS — BP 130/66 | Wt 165.0 lb

## 2020-01-17 DIAGNOSIS — Z3A28 28 weeks gestation of pregnancy: Secondary | ICD-10-CM

## 2020-01-17 DIAGNOSIS — Z113 Encounter for screening for infections with a predominantly sexual mode of transmission: Secondary | ICD-10-CM

## 2020-01-17 DIAGNOSIS — Z3403 Encounter for supervision of normal first pregnancy, third trimester: Secondary | ICD-10-CM

## 2020-01-17 DIAGNOSIS — Z130 Encounter for screening for diseases of the blood and blood-forming organs and certain disorders involving the immune mechanism: Secondary | ICD-10-CM

## 2020-01-17 DIAGNOSIS — Z3400 Encounter for supervision of normal first pregnancy, unspecified trimester: Secondary | ICD-10-CM

## 2020-01-17 DIAGNOSIS — O219 Vomiting of pregnancy, unspecified: Secondary | ICD-10-CM

## 2020-01-17 DIAGNOSIS — Z131 Encounter for screening for diabetes mellitus: Secondary | ICD-10-CM

## 2020-01-17 DIAGNOSIS — O210 Mild hyperemesis gravidarum: Secondary | ICD-10-CM

## 2020-01-17 LAB — POCT URINALYSIS DIPSTICK OB
Glucose, UA: NEGATIVE
POC,PROTEIN,UA: NEGATIVE

## 2020-01-17 NOTE — Progress Notes (Signed)
ROB 28 week labs 

## 2020-01-17 NOTE — Progress Notes (Signed)
    Routine Prenatal Care Visit  Subjective  Tammy Jefferson is a 24 y.o. G1P0 at [redacted]w[redacted]d being seen today for ongoing prenatal care.  She is currently monitored for the following issues for this low-risk pregnancy and has Supervision of normal first pregnancy, antepartum; Nausea and vomiting during pregnancy; and Hyperemesis complicating pregnancy, antepartum on their problem list.  ----------------------------------------------------------------------------------- Patient reports no complaints.   Contractions: Not present. Vag. Bleeding: None.  Movement: Present. Denies leaking of fluid.  ----------------------------------------------------------------------------------- The following portions of the patient's history were reviewed and updated as appropriate: allergies, current medications, past family history, past medical history, past social history, past surgical history and problem list. Problem list updated.   Objective  Blood pressure 130/66, weight 165 lb (74.8 kg), last menstrual period 06/04/2019. Pregravid weight 138 lb (62.6 kg) Total Weight Gain 27 lb (12.2 kg) Urinalysis:      Fetal Status: Fetal Heart Rate (bpm): 140 Fundal Height: 28 cm Movement: Present     General:  Alert, oriented and cooperative. Patient is in no acute distress.  Skin: Skin is warm and dry. No rash noted.   Cardiovascular: Normal heart rate noted  Respiratory: Normal respiratory effort, no problems with respiration noted  Abdomen: Soft, gravid, appropriate for gestational age. Pain/Pressure: Absent     Pelvic:  Cervical exam deferred        Extremities: Normal range of motion.     ental Status: Normal mood and affect. Normal behavior. Normal judgment and thought content.     Assessment   24 y.o. G1P0 at [redacted]w[redacted]d by  04/06/2020, by Ultrasound presenting for routine prenatal visit  Plan   pregnancy1 Problems (from 06/04/19 to present)    Problem Noted Resolved   Supervision of normal first  pregnancy, antepartum 08/27/2019 by Rexene Agent, CNM No   Overview Addendum 10/25/2019  1:38 PM by Dalia Heading, [place] Prenatal Labs  Dating 9wk5d ultrasound Blood type: O/Positive/-- (09/18 1537)   Genetic Screen  MSAFP:  neg  NIPS: diploid XX Antibody:Negative (09/18 1537)  Anatomic [place]  Rubella: 17.40 (09/18 1537) Varicella:  Nonimmune  GTT Early:               Third trimester:  RPR: Non Reactive (09/18 1537)   Rhogam  HBsAg: Negative (09/18 1537)   TDaP vaccine                       Flu Shot: HIV: Non Reactive (09/18 1537)   Baby Food                                GBS:   Contraception  Pap: [place] postpartum  CBB     CS/VBAC    Support Person                  Gestational age appropriate obstetric precautions including but not limited to vaginal bleeding, contractions, leaking of fluid and fetal movement were reviewed in detail with the patient.    28 week labs today Interested in [facility]  Return in about 2 weeks (around 01/31/2020) for [place].  Malachy Mood, MD, [facility], [facility] 01/17/2020, 10:31 AM

## 2020-01-18 LAB — 28 WEEK RH+PANEL
Basophils Absolute: 0 10*3/uL (ref 0.0–0.2)
Basos: 0 %
EOS (ABSOLUTE): 0 10*3/uL (ref 0.0–0.4)
Eos: 1 %
Gestational Diabetes Screen: 94 mg/dL (ref 65–139)
HIV Screen 4th Generation wRfx: NONREACTIVE
Hematocrit: 33.4 % — ABNORMAL LOW (ref 34.0–46.6)
Hemoglobin: 10.9 g/dL — ABNORMAL LOW (ref 11.1–15.9)
Immature Grans (Abs): 0.1 10*3/uL (ref 0.0–0.1)
Immature Granulocytes: 1 %
Lymphocytes Absolute: 1.4 10*3/uL (ref 0.7–3.1)
Lymphs: 22 %
MCH: 27.8 pg (ref 26.6–33.0)
MCHC: 32.6 g/dL (ref 31.5–35.7)
MCV: 85 fL (ref 79–97)
Monocytes Absolute: 0.5 10*3/uL (ref 0.1–0.9)
Monocytes: 8 %
Neutrophils Absolute: 4.1 10*3/uL (ref 1.4–7.0)
Neutrophils: 68 %
Platelets: 262 10*3/uL (ref 150–450)
RBC: 3.92 x10E6/uL (ref 3.77–5.28)
RDW: 13.9 % (ref 11.7–15.4)
RPR Ser Ql: NONREACTIVE
WBC: 6.1 10*3/uL (ref 3.4–10.8)

## 2020-01-31 ENCOUNTER — Ambulatory Visit (INDEPENDENT_AMBULATORY_CARE_PROVIDER_SITE_OTHER): Payer: BC Managed Care – PPO | Admitting: Certified Nurse Midwife

## 2020-01-31 ENCOUNTER — Other Ambulatory Visit: Payer: Self-pay

## 2020-01-31 VITALS — BP 110/60 | Wt 165.0 lb

## 2020-01-31 DIAGNOSIS — Z23 Encounter for immunization: Secondary | ICD-10-CM

## 2020-01-31 DIAGNOSIS — Z3400 Encounter for supervision of normal first pregnancy, unspecified trimester: Secondary | ICD-10-CM

## 2020-01-31 DIAGNOSIS — Z3A30 30 weeks gestation of pregnancy: Secondary | ICD-10-CM

## 2020-01-31 LAB — POCT URINALYSIS DIPSTICK OB: Glucose, UA: NEGATIVE

## 2020-01-31 NOTE — Progress Notes (Signed)
C/o pelvic pressure increasing.rj

## 2020-02-06 NOTE — Progress Notes (Signed)
ROB at 30wk4d: SOme increasing pressure, no vaginal bleeding, regular contractions or leakage of fluid. Baby active Borderline anemia on 28 week labs with H&H 10.9gm/dl and hct 33.4% Is taking iron gummies TDAp today. Signed BT consent The following were addressed during this visit:  Breastfeeding Education - Early initiation of breastfeeding    Comments: Keeps milk supply adequate, helps contract uterus and slow bleeding, and early milk is the perfect first food and is easy to digest.   - The importance of early skin-to-skin contact    Comments: Keeps baby warm and secure, helps keep baby's blood sugar up and breathing steady, easier to bond and breastfeed, and helps calm baby.  - Rooming-in on a 24-hour basis    Comments: Easier to learn baby's feeding cues, easier to bond and get to know each other, and encourages milk production.   Also discussed virtual CB classes. ROB in 2 weeks.  Dalia Heading, CNM

## 2020-02-15 ENCOUNTER — Other Ambulatory Visit: Payer: Self-pay

## 2020-02-15 ENCOUNTER — Ambulatory Visit (INDEPENDENT_AMBULATORY_CARE_PROVIDER_SITE_OTHER): Payer: BC Managed Care – PPO | Admitting: Certified Nurse Midwife

## 2020-02-15 VITALS — BP 110/60 | Temp 98.0°F | Wt 167.0 lb

## 2020-02-15 DIAGNOSIS — Z3403 Encounter for supervision of normal first pregnancy, third trimester: Secondary | ICD-10-CM

## 2020-02-15 DIAGNOSIS — Z3400 Encounter for supervision of normal first pregnancy, unspecified trimester: Secondary | ICD-10-CM

## 2020-02-15 DIAGNOSIS — Z3A32 32 weeks gestation of pregnancy: Secondary | ICD-10-CM

## 2020-02-15 LAB — POCT URINALYSIS DIPSTICK OB
Glucose, UA: NEGATIVE
POC,PROTEIN,UA: NEGATIVE

## 2020-02-15 NOTE — Progress Notes (Signed)
C/o pelvic pain noticeable after being on feet awhile.

## 2020-02-15 NOTE — Progress Notes (Signed)
ROB at 32wk5d: Taking iron supplements. Baby active. After being up on feet for a while, has BH contractions and increased pelvic pressure-both resolve with rest. Breast feeding Lactation student in to talk with patient FH 32 and FHT 144. BP 110/60  ROB in 2 weeks. Dalia Heading, CNM

## 2020-02-15 NOTE — Lactation Note (Signed)
Lactation Consultation Note  Patient Name: BRYANDA SAVITT Today's Date: 02/15/2020     Maternal Data    Feeding    LATCH Score                   Interventions    Lactation Tools Discussed/Used     Consult Status   Lactation student discussed benefits of breastfeeding per the Ready, Set, Baby curriculum. [place] encouraged to review breastfeeding information on Ready, set, Baby web site and given information for virtual breastfeeding classes.     Susette Racer Akeylah Hendel 02/15/2020, 11:08 AM

## 2020-02-29 ENCOUNTER — Ambulatory Visit (INDEPENDENT_AMBULATORY_CARE_PROVIDER_SITE_OTHER): Payer: BC Managed Care – PPO | Admitting: Certified Nurse Midwife

## 2020-02-29 ENCOUNTER — Other Ambulatory Visit: Payer: Self-pay

## 2020-02-29 VITALS — BP 120/60 | Wt 171.0 lb

## 2020-02-29 DIAGNOSIS — Z3A34 34 weeks gestation of pregnancy: Secondary | ICD-10-CM

## 2020-02-29 DIAGNOSIS — Z3400 Encounter for supervision of normal first pregnancy, unspecified trimester: Secondary | ICD-10-CM

## 2020-02-29 DIAGNOSIS — O26893 Other specified pregnancy related conditions, third trimester: Secondary | ICD-10-CM

## 2020-02-29 DIAGNOSIS — R102 Pelvic and perineal pain: Secondary | ICD-10-CM

## 2020-02-29 LAB — POCT URINALYSIS DIPSTICK OB: Glucose, UA: NEGATIVE

## 2020-02-29 NOTE — Progress Notes (Signed)
C/o round ligament pain.rj

## 2020-03-01 NOTE — Progress Notes (Signed)
ROB at 34wk5d: Baby active. Having discomforts of pregnancy. Will be breast feeding  FH 35cm, FHT WNL  ROB in 2 weeks for GBS/ APtima 34 week instructions reviewed.  Dalia Heading, CNM

## 2020-03-16 ENCOUNTER — Ambulatory Visit (INDEPENDENT_AMBULATORY_CARE_PROVIDER_SITE_OTHER): Payer: BC Managed Care – PPO | Admitting: Certified Nurse Midwife

## 2020-03-16 ENCOUNTER — Other Ambulatory Visit: Payer: Self-pay

## 2020-03-16 VITALS — BP 120/74 | Wt 175.0 lb

## 2020-03-16 DIAGNOSIS — Z113 Encounter for screening for infections with a predominantly sexual mode of transmission: Secondary | ICD-10-CM

## 2020-03-16 DIAGNOSIS — Z3403 Encounter for supervision of normal first pregnancy, third trimester: Secondary | ICD-10-CM

## 2020-03-16 DIAGNOSIS — Z3A37 37 weeks gestation of pregnancy: Secondary | ICD-10-CM

## 2020-03-16 DIAGNOSIS — Z3685 Encounter for antenatal screening for Streptococcus B: Secondary | ICD-10-CM

## 2020-03-16 NOTE — Progress Notes (Signed)
ROB GBS/Aptima C/o contactions, feet feel like they have pins and needles, sciatic pain on right side  Denies lof, no vb, Good FM

## 2020-03-16 NOTE — Progress Notes (Signed)
ROB at 37 weeks: Having some lumbar pain on right side and complains of some swelling and tingling in her feet. No dysuria Baby active. Irregular contractions with increased pelvic pain. No vaginal bleeding or LOF.  FHTs WNL on maternal right. BP 120/74. Trace pedal edema. Cephalic presentation. Cervix closed and posterior/ 75%/ -1 to 0 Aptima and GBS today Labor precautions ROB in 1 week  Dalia Heading, CNM

## 2020-03-19 LAB — CHLAMYDIA/GONOCOCCUS/TRICHOMONAS, NAA
Chlamydia by NAA: NEGATIVE
Gonococcus by NAA: NEGATIVE
Trich vag by NAA: NEGATIVE

## 2020-03-20 LAB — CULTURE, BETA STREP (GROUP B ONLY): Strep Gp B Culture: NEGATIVE

## 2020-03-23 ENCOUNTER — Ambulatory Visit (INDEPENDENT_AMBULATORY_CARE_PROVIDER_SITE_OTHER): Payer: BC Managed Care – PPO | Admitting: Certified Nurse Midwife

## 2020-03-23 ENCOUNTER — Other Ambulatory Visit: Payer: Self-pay

## 2020-03-23 VITALS — BP 122/76 | Wt 178.0 lb

## 2020-03-23 DIAGNOSIS — Z3A38 38 weeks gestation of pregnancy: Secondary | ICD-10-CM

## 2020-03-23 DIAGNOSIS — Z3403 Encounter for supervision of normal first pregnancy, third trimester: Secondary | ICD-10-CM

## 2020-03-23 LAB — POCT URINALYSIS DIPSTICK OB
Glucose, UA: NEGATIVE
POC,PROTEIN,UA: NEGATIVE

## 2020-03-23 NOTE — Progress Notes (Signed)
No vb. No lof. Inconsistent contractions.

## 2020-03-25 NOTE — Progress Notes (Signed)
ROB at 38 weeks: Doing well. Irregular contractions and good FM. GBS was negative  FH 38 cm and FHTs WNL  A: IUP at 38 weeks S=D  P: BC options reviewed Labor precautions ROB 1 week.  Dalia Heading, CNM

## 2020-03-26 ENCOUNTER — Inpatient Hospital Stay
Admission: EM | Admit: 2020-03-26 | Discharge: 2020-03-27 | DRG: 806 | Disposition: A | Discharge status: Home / Self Care | Payer: BC Managed Care – PPO | Attending: Obstetrics and Gynecology | Admitting: Obstetrics and Gynecology

## 2020-03-26 ENCOUNTER — Inpatient Hospital Stay: Payer: BC Managed Care – PPO | Admitting: Anesthesiology

## 2020-03-26 ENCOUNTER — Other Ambulatory Visit: Payer: Self-pay

## 2020-03-26 ENCOUNTER — Encounter: Payer: Self-pay | Admitting: Obstetrics and Gynecology

## 2020-03-26 DIAGNOSIS — D62 Acute posthemorrhagic anemia: Secondary | ICD-10-CM | POA: Diagnosis not present

## 2020-03-26 DIAGNOSIS — O26893 Other specified pregnancy related conditions, third trimester: Secondary | ICD-10-CM | POA: Diagnosis present

## 2020-03-26 DIAGNOSIS — O9081 Anemia of the puerperium: Secondary | ICD-10-CM | POA: Diagnosis not present

## 2020-03-26 DIAGNOSIS — Z20822 Contact with and (suspected) exposure to covid-19: Secondary | ICD-10-CM | POA: Diagnosis present

## 2020-03-26 DIAGNOSIS — O4292 Full-term premature rupture of membranes, unspecified as to length of time between rupture and onset of labor: Secondary | ICD-10-CM | POA: Diagnosis present

## 2020-03-26 DIAGNOSIS — Z3A38 38 weeks gestation of pregnancy: Secondary | ICD-10-CM

## 2020-03-26 DIAGNOSIS — Z3400 Encounter for supervision of normal first pregnancy, unspecified trimester: Secondary | ICD-10-CM

## 2020-03-26 DIAGNOSIS — O4202 Full-term premature rupture of membranes, onset of labor within 24 hours of rupture: Secondary | ICD-10-CM | POA: Diagnosis not present

## 2020-03-26 DIAGNOSIS — O4290 Premature rupture of membranes, unspecified as to length of time between rupture and onset of labor, unspecified weeks of gestation: Secondary | ICD-10-CM | POA: Diagnosis present

## 2020-03-26 LAB — CHLAMYDIA/NGC RT PCR (ARMC ONLY)
Chlamydia Tr: NOT DETECTED
N gonorrhoeae: NOT DETECTED

## 2020-03-26 LAB — CBC
HCT: 37.3 % (ref 36.0–46.0)
Hemoglobin: 11.8 g/dL — ABNORMAL LOW (ref 12.0–15.0)
MCH: 25.7 pg — ABNORMAL LOW (ref 26.0–34.0)
MCHC: 31.6 g/dL (ref 30.0–36.0)
MCV: 81.3 fL (ref 80.0–100.0)
Platelets: 292 10*3/uL (ref 150–400)
RBC: 4.59 MIL/uL (ref 3.87–5.11)
RDW: 16.8 % — ABNORMAL HIGH (ref 11.5–15.5)
WBC: 5.9 10*3/uL (ref 4.0–10.5)
nRBC: 0 % (ref 0.0–0.2)

## 2020-03-26 LAB — URINE DRUG SCREEN, QUALITATIVE (ARMC ONLY)
Amphetamines, Ur Screen: NOT DETECTED
Barbiturates, Ur Screen: NOT DETECTED
Benzodiazepine, Ur Scrn: NOT DETECTED
Cannabinoid 50 Ng, Ur ~~LOC~~: NOT DETECTED
Cocaine Metabolite,Ur ~~LOC~~: NOT DETECTED
MDMA (Ecstasy)Ur Screen: NOT DETECTED
Methadone Scn, Ur: NOT DETECTED
Opiate, Ur Screen: NOT DETECTED
Phencyclidine (PCP) Ur S: NOT DETECTED
Tricyclic, Ur Screen: NOT DETECTED

## 2020-03-26 LAB — RESPIRATORY PANEL BY RT PCR (FLU A&B, COVID)
Influenza A by PCR: NEGATIVE
Influenza B by PCR: NEGATIVE
SARS Coronavirus 2 by RT PCR: NEGATIVE

## 2020-03-26 LAB — TYPE AND SCREEN
ABO/RH(D): O POS
Antibody Screen: NEGATIVE

## 2020-03-26 LAB — RPR: RPR Ser Ql: NONREACTIVE

## 2020-03-26 MED ORDER — ONDANSETRON HCL 4 MG/2ML IJ SOLN: 4.0000 mg | Freq: Four times a day (QID) | INTRAMUSCULAR | Status: DC | PRN

## 2020-03-26 MED ORDER — OXYTOCIN BOLUS FROM INFUSION
500.0000 mL | Freq: Once | INTRAVENOUS | Status: AC
  Administered 2020-03-26: 500 mL via INTRAVENOUS

## 2020-03-26 MED ORDER — EPHEDRINE 5 MG/ML INJ
10.0000 mg | INTRAVENOUS | Status: DC | PRN
  Filled 2020-03-26: qty 2

## 2020-03-26 MED ORDER — LACTATED RINGERS IV SOLN
500.0000 mL | Freq: Once | INTRAVENOUS | Status: AC
  Administered 2020-03-26: 500 mL via INTRAVENOUS

## 2020-03-26 MED ORDER — AMMONIA AROMATIC IN INHA
RESPIRATORY_TRACT | Status: AC
  Filled 2020-03-26: qty 10

## 2020-03-26 MED ORDER — LIDOCAINE HCL (PF) 1 % IJ SOLN
INTRAMUSCULAR | Status: AC
  Filled 2020-03-26: qty 30

## 2020-03-26 MED ORDER — WITCH HAZEL-GLYCERIN EX PADS: 1.0000 "application " | MEDICATED_PAD | CUTANEOUS | Status: DC | PRN

## 2020-03-26 MED ORDER — VARICELLA VIRUS VACCINE LIVE 1350 PFU/0.5ML IJ SUSR
0.5000 mL | INTRAMUSCULAR | Status: DC | PRN
  Filled 2020-03-26: qty 0.5

## 2020-03-26 MED ORDER — ACETAMINOPHEN 325 MG PO TABS: 650.0000 mg | ORAL_TABLET | ORAL | Status: DC | PRN

## 2020-03-26 MED ORDER — SIMETHICONE 80 MG PO CHEW: 80.0000 mg | CHEWABLE_TABLET | ORAL | Status: DC | PRN

## 2020-03-26 MED ORDER — ONDANSETRON HCL 4 MG/2ML IJ SOLN: 4.0000 mg | INTRAMUSCULAR | Status: DC | PRN

## 2020-03-26 MED ORDER — PHENYLEPHRINE 40 MCG/ML (10ML) SYRINGE FOR IV PUSH (FOR BLOOD PRESSURE SUPPORT)
80.0000 ug | PREFILLED_SYRINGE | INTRAVENOUS | Status: DC | PRN
  Filled 2020-03-26: qty 10

## 2020-03-26 MED ORDER — BENZOCAINE-MENTHOL 20-0.5 % EX AERO
1.0000 "application " | INHALATION_SPRAY | CUTANEOUS | Status: DC | PRN
  Filled 2020-03-26: qty 56

## 2020-03-26 MED ORDER — OXYCODONE-ACETAMINOPHEN 5-325 MG PO TABS: 2.0000 | ORAL_TABLET | ORAL | Status: DC | PRN

## 2020-03-26 MED ORDER — LACTATED RINGERS IV SOLN: 500.0000 mL | INTRAVENOUS | Status: DC | PRN

## 2020-03-26 MED ORDER — SODIUM CHLORIDE 0.9 % IV SOLN
INTRAVENOUS | Status: DC | PRN
  Administered 2020-03-26 (×2): 5 mL via EPIDURAL

## 2020-03-26 MED ORDER — LIDOCAINE-EPINEPHRINE (PF) 1.5 %-1:200000 IJ SOLN
INTRAMUSCULAR | Status: DC | PRN
  Administered 2020-03-26: 3 mL via EPIDURAL

## 2020-03-26 MED ORDER — PRENATAL MULTIVITAMIN CH
1.0000 | ORAL_TABLET | Freq: Every day | ORAL | Status: DC
  Administered 2020-03-27: 1 via ORAL
  Filled 2020-03-26: qty 1

## 2020-03-26 MED ORDER — OXYTOCIN 10 UNIT/ML IJ SOLN
INTRAMUSCULAR | Status: AC
  Filled 2020-03-26: qty 2

## 2020-03-26 MED ORDER — FENTANYL 2.5 MCG/ML W/ROPIVACAINE 0.15% IN NS 100 ML EPIDURAL (ARMC)
12.0000 mL/h | EPIDURAL | Status: DC
  Administered 2020-03-26: 12 mL/h via EPIDURAL

## 2020-03-26 MED ORDER — OXYTOCIN 40 UNITS IN NORMAL SALINE INFUSION - SIMPLE MED: 1.0000 m[IU]/min | INTRAVENOUS | Status: DC

## 2020-03-26 MED ORDER — BUTORPHANOL TARTRATE 1 MG/ML IJ SOLN: 1.0000 mg | INTRAMUSCULAR | Status: DC | PRN

## 2020-03-26 MED ORDER — FENTANYL 2.5 MCG/ML W/ROPIVACAINE 0.15% IN NS 100 ML EPIDURAL (ARMC)
EPIDURAL | Status: AC
  Filled 2020-03-26: qty 100

## 2020-03-26 MED ORDER — SOD CITRATE-CITRIC ACID 500-334 MG/5ML PO SOLN: 30.0000 mL | ORAL | Status: DC | PRN

## 2020-03-26 MED ORDER — DIPHENHYDRAMINE HCL 25 MG PO CAPS: 25.0000 mg | ORAL_CAPSULE | Freq: Four times a day (QID) | ORAL | Status: DC | PRN

## 2020-03-26 MED ORDER — DIPHENHYDRAMINE HCL 50 MG/ML IJ SOLN: 12.5000 mg | INTRAMUSCULAR | Status: DC | PRN

## 2020-03-26 MED ORDER — ONDANSETRON HCL 4 MG PO TABS: 4.0000 mg | ORAL_TABLET | ORAL | Status: DC | PRN

## 2020-03-26 MED ORDER — TERBUTALINE SULFATE 1 MG/ML IJ SOLN: 0.2500 mg | Freq: Once | INTRAMUSCULAR | Status: DC | PRN

## 2020-03-26 MED ORDER — MISOPROSTOL 200 MCG PO TABS
ORAL_TABLET | ORAL | Status: AC
  Filled 2020-03-26: qty 4

## 2020-03-26 MED ORDER — COCONUT OIL OIL: 1.0000 "application " | TOPICAL_OIL | Status: DC | PRN

## 2020-03-26 MED ORDER — OXYCODONE-ACETAMINOPHEN 5-325 MG PO TABS: 1.0000 | ORAL_TABLET | ORAL | Status: DC | PRN

## 2020-03-26 MED ORDER — SENNOSIDES-DOCUSATE SODIUM 8.6-50 MG PO TABS
2.0000 | ORAL_TABLET | ORAL | Status: DC
  Administered 2020-03-27: 2 via ORAL
  Filled 2020-03-26: qty 2

## 2020-03-26 MED ORDER — DIBUCAINE (PERIANAL) 1 % EX OINT: 1.0000 "application " | TOPICAL_OINTMENT | CUTANEOUS | Status: DC | PRN

## 2020-03-26 MED ORDER — LIDOCAINE HCL (PF) 1 % IJ SOLN
30.0000 mL | INTRAMUSCULAR | Status: AC | PRN
  Administered 2020-03-26: 1 mL via SUBCUTANEOUS

## 2020-03-26 MED ORDER — IBUPROFEN 600 MG PO TABS
600.0000 mg | ORAL_TABLET | Freq: Four times a day (QID) | ORAL | Status: DC
  Administered 2020-03-27 (×4): 600 mg via ORAL
  Filled 2020-03-26 (×4): qty 1

## 2020-03-26 MED ORDER — LACTATED RINGERS IV SOLN: INTRAVENOUS | Status: DC

## 2020-03-26 MED ORDER — OXYTOCIN 40 UNITS IN NORMAL SALINE INFUSION - SIMPLE MED
2.5000 [IU]/h | INTRAVENOUS | Status: DC
  Administered 2020-03-26: 2.5 [IU]/h via INTRAVENOUS
  Filled 2020-03-26: qty 1000

## 2020-03-26 NOTE — Anesthesia Preprocedure Evaluation (Signed)
Anesthesia Evaluation  Patient identified by MRN, date of birth, ID band Patient awake    Reviewed: Allergy & Precautions, H&P , NPO status , Patient's Chart, lab work & pertinent test results  History of Anesthesia Complications Negative for: history of anesthetic complications  Airway Mallampati: III  TM Distance: >3 FB Neck ROM: full    Dental  (+) Chipped   Pulmonary neg pulmonary ROS,           Cardiovascular Exercise Tolerance: Good (-) hypertensionnegative cardio ROS       Neuro/Psych    GI/Hepatic negative GI ROS,   Endo/Other    Renal/GU   negative genitourinary   Musculoskeletal   Abdominal   Peds  Hematology negative hematology ROS (+)   Anesthesia Other Findings Past Medical History: No date: Cyst (solitary) of breast  History reviewed. No pertinent surgical history.  BMI    Body Mass Index: 30.55 kg/m      Reproductive/Obstetrics (+) Pregnancy                             Anesthesia Physical Anesthesia Plan  ASA: II  Anesthesia Plan: Epidural   Post-op Pain Management:    Induction:   PONV Risk Score and Plan:   Airway Management Planned: Natural Airway  Additional Equipment:   Intra-op Plan:   Post-operative Plan:   Informed Consent: I have reviewed the patients History and Physical, chart, labs and discussed the procedure including the risks, benefits and alternatives for the proposed anesthesia with the patient or authorized representative who has indicated his/her understanding and acceptance.     Dental Advisory Given  Plan Discussed with: Anesthesiologist  Anesthesia Plan Comments: (Patient reports no bleeding problems and no anticoagulant use.   Patient consented for risks of anesthesia including but not limited to:  - adverse reactions to medications - risk of bleeding, infection, nerve damage and headache - risk of failed epidural -  nerve damage due to positioning - Damage to heart, brain, lungs or loss of life  Patient voiced understanding.)        Anesthesia Quick Evaluation

## 2020-03-26 NOTE — Discharge Summary (Signed)
Obstetric Discharge Summary Reason for Admission: onset of labor Prenatal Procedures: NST Intrapartum Procedures: spontaneous vaginal delivery Postpartum Procedures: none Complications-Operative and Postpartum: none Hemoglobin  Date Value Ref Range Status  03/27/2020 9.6 (L) 12.0 - 15.0 g/dL Final  01/17/2020 10.9 (L) 11.1 - 15.9 g/dL Final   HCT  Date Value Ref Range Status  03/27/2020 29.2 (L) 36.0 - 46.0 % Final   Hematocrit  Date Value Ref Range Status  01/17/2020 33.4 (L) 34.0 - 46.6 % Final    Physical Exam:  General: alert, cooperative, appears stated age and no distress Lochia: appropriate Uterine Fundus: firm Incision: NA DVT Evaluation: No evidence of DVT seen on physical exam.  Discharge Diagnoses: Term Pregnancy-delivered  Discharge Information: Date: 03/27/2020 Activity: pelvic rest Diet: routine Allergies as of 03/27/2020   No Known Allergies     Medication List    STOP taking these medications   senna-docusate 8.6-50 MG tablet Commonly known as: Senokot-S     TAKE these medications   Black Elderberry(Berry-Flower) 575 MG Caps Take 1 capsule by mouth daily.   IRON SUPPLEMENT PO Take 1 capsule by mouth daily.   Prenatal Vitamins 28-0.8 MG Tabs TK 1 T PO ONCE A DAY   Vitamin D3 1.25 MG (50000 UT) Caps TK 1 C PO ONCE A WK       Condition: stable Discharge to: home Follow-up Information    Malachy Mood, MD. Schedule an appointment as soon as possible for a visit in 6 week(s).   Specialty: Obstetrics and Gynecology Why: please call and schedule an appointment for 6 weeks for a postpartum visit  Contact information: [address] [place] [address] [phone]           Newborn Data: Live born female [place] Birth Weight:  6 pounds 7 ounces, 2910 g APGAR: 103, 9  Newborn Delivery   Birth date/time: 03/26/2020 21:11:00 Delivery type: Vaginal, Spontaneous      Home with mother.  Rod Can, CNM 03/27/2020,  10:40 PM

## 2020-03-26 NOTE — Progress Notes (Signed)
   Subjective:  Feeling more pressure  Objective:   Vitals: Blood pressure 116/79, pulse 86, temperature 98.1 F (36.7 C), temperature source Oral, resp. rate 18, height 5\' 4"  (1.626 m), weight 80.7 kg, last menstrual period 06/04/2019. General: NAD Abdomen: gravid, non-tender Cervical Exam:  Dilation: 5 Effacement (%): 80 Cervical Position: Middle Station: 0 Exam by:: Dr Georgianne Fick  FHT: 150, moderate, +accels, earlies Toco:q3-65min  Results for orders placed or performed during the hospital encounter of 03/26/20 (from the past 24 hour(s))  Respiratory Panel by RT PCR (Flu A&B, Covid) - Nasopharyngeal Swab     Status: None   Collection Time: 03/26/20  3:48 AM   Specimen: Nasopharyngeal Swab  Result Value Ref Range   SARS Coronavirus 2 by RT PCR NEGATIVE NEGATIVE   Influenza A by PCR NEGATIVE NEGATIVE   Influenza B by PCR NEGATIVE NEGATIVE  CBC     Status: Abnormal   Collection Time: 03/26/20  4:21 AM  Result Value Ref Range   WBC 5.9 4.0 - 10.5 K/uL   RBC 4.59 3.87 - 5.11 MIL/uL   Hemoglobin 11.8 (L) 12.0 - 15.0 g/dL   HCT 37.3 36.0 - 46.0 %   MCV 81.3 80.0 - 100.0 fL   MCH 25.7 (L) 26.0 - 34.0 pg   MCHC 31.6 30.0 - 36.0 g/dL   RDW 16.8 (H) 11.5 - 15.5 %   Platelets 292 150 - 400 K/uL   nRBC 0.0 0.0 - 0.2 %  Chlamydia/NGC rt PCR ([facility] only)     Status: None   Collection Time: 03/26/20  4:21 AM   Specimen: Urine  Result Value Ref Range   Specimen source GC/Chlam URINE, RANDOM    Chlamydia Tr NOT DETECTED NOT DETECTED   N gonorrhoeae NOT DETECTED NOT DETECTED  Urine Drug Screen, Qualitative ([facility] only)     Status: None   Collection Time: 03/26/20  4:21 AM  Result Value Ref Range   Tricyclic, Ur Screen NONE DETECTED NONE DETECTED   Amphetamines, Ur Screen NONE DETECTED NONE DETECTED   MDMA (Ecstasy)Ur Screen NONE DETECTED NONE DETECTED   Cocaine Metabolite,Ur [place] NONE DETECTED NONE DETECTED   Opiate, Ur Screen NONE DETECTED NONE DETECTED   Phencyclidine (PCP) Ur S  NONE DETECTED NONE DETECTED   Cannabinoid 50 Ng, Ur [place] NONE DETECTED NONE DETECTED   Barbiturates, Ur Screen NONE DETECTED NONE DETECTED   Benzodiazepine, Ur Scrn NONE DETECTED NONE DETECTED   Methadone Scn, Ur NONE DETECTED NONE DETECTED  Type and screen     Status: None   Collection Time: 03/26/20  6:08 AM  Result Value Ref Range   ABO/RH(D) O POS    Antibody Screen NEG    Sample Expiration      03/29/2020,2359 Performed at [facility], [address] [place], [place] [address]     Assessment:   24 y.o. G1P0 [redacted]w[redacted]d normal labor  Plan:   1) Labor - making change on her own contractions getting stronger and increasing in frequency  2) Fetus - cat I  Malachy Mood, MD, [place], [facility] 03/26/2020, 11:33 AM

## 2020-03-26 NOTE — Progress Notes (Signed)
   Subjective:  Comfortable, epidural now in place  Objective:   Vitals: Blood pressure 125/76, pulse 91, temperature 98.5 F (36.9 C), temperature source Oral, resp. rate 18, height 5\' 4"  (1.626 m), weight 80.7 kg, last menstrual period 06/04/2019. General: NAD Abdomen: gravid, non-tender Cervical Exam:  Dilation: 8 Effacement (%): 90 Cervical Position: Middle Station: 0 Exam by:: Helder Crisafulli  FHT: 130, moderate, +accels, no decels Toco: q2-65min  Results for orders placed or performed during the hospital encounter of 03/26/20 (from the past 24 hour(s))  Respiratory Panel by RT PCR (Flu A&B, Covid) - Nasopharyngeal Swab     Status: None   Collection Time: 03/26/20  3:48 AM   Specimen: Nasopharyngeal Swab  Result Value Ref Range   SARS Coronavirus 2 by RT PCR NEGATIVE NEGATIVE   Influenza A by PCR NEGATIVE NEGATIVE   Influenza B by PCR NEGATIVE NEGATIVE  CBC     Status: Abnormal   Collection Time: 03/26/20  4:21 AM  Result Value Ref Range   WBC 5.9 4.0 - 10.5 K/uL   RBC 4.59 3.87 - 5.11 MIL/uL   Hemoglobin 11.8 (L) 12.0 - 15.0 g/dL   HCT 37.3 36.0 - 46.0 %   MCV 81.3 80.0 - 100.0 fL   MCH 25.7 (L) 26.0 - 34.0 pg   MCHC 31.6 30.0 - 36.0 g/dL   RDW 16.8 (H) 11.5 - 15.5 %   Platelets 292 150 - 400 K/uL   nRBC 0.0 0.0 - 0.2 %  RPR     Status: None   Collection Time: 03/26/20  4:21 AM  Result Value Ref Range   RPR Ser Ql NON REACTIVE NON REACTIVE  Chlamydia/NGC rt PCR ([facility] only)     Status: None   Collection Time: 03/26/20  4:21 AM   Specimen: Urine  Result Value Ref Range   Specimen source GC/Chlam URINE, RANDOM    Chlamydia Tr NOT DETECTED NOT DETECTED   N gonorrhoeae NOT DETECTED NOT DETECTED  Urine Drug Screen, Qualitative ([facility] only)     Status: None   Collection Time: 03/26/20  4:21 AM  Result Value Ref Range   Tricyclic, Ur Screen NONE DETECTED NONE DETECTED   Amphetamines, Ur Screen NONE DETECTED NONE DETECTED   MDMA (Ecstasy)Ur Screen NONE DETECTED NONE  DETECTED   Cocaine Metabolite,Ur [place] NONE DETECTED NONE DETECTED   Opiate, Ur Screen NONE DETECTED NONE DETECTED   Phencyclidine (PCP) Ur S NONE DETECTED NONE DETECTED   Cannabinoid 50 Ng, Ur [place] NONE DETECTED NONE DETECTED   Barbiturates, Ur Screen NONE DETECTED NONE DETECTED   Benzodiazepine, Ur Scrn NONE DETECTED NONE DETECTED   Methadone Scn, Ur NONE DETECTED NONE DETECTED  Type and screen     Status: None   Collection Time: 03/26/20  6:08 AM  Result Value Ref Range   ABO/RH(D) O POS    Antibody Screen NEG    Sample Expiration      03/29/2020,2359 Performed at [facility], [address], [place], [place] [address]     Assessment:   24 y.o. G1P0 [redacted]w[redacted]d term labor  Plan:   1) Labor - still progressing naturally  2) Fetus - cat I tracing  Malachy Mood, MD, [place], [facility] 03/26/2020, 4:05 PM

## 2020-03-26 NOTE — OB Triage Note (Signed)
Pt. Present in triage with c/o of SROM and CTXs beginning at 0100.  +FM, no VB and clear fluid.

## 2020-03-26 NOTE — H&P (Signed)
.   Obstetric H&P   Chief Complaint: Leaking fluid  Prenatal Care Provider: [facility]  History of Present Illness: 24 y.o. G1P0 [redacted]w[redacted]d by 04/06/2020, by Ultrasound presenting to L&D with clear loss of fluid.  +FM, no vaginal bleeding, increasing contractions.  Pregnancy has been uncomplicated to date.   Pregravid weight 62.6 kg Total Weight Gain 18.1 kg  pregnancy1 Problems (from 06/04/19 to present)    Problem Noted Resolved   Supervision of normal first pregnancy, antepartum 08/27/2019 by Rexene Agent, CNM No   Overview Addendum 03/25/2020  6:23 PM by Dalia Heading, [place] Prenatal Labs  Dating 9wk5d ultrasound Blood type: O/Positive/-- (09/18 1537)   Genetic Screen  MSAFP:  neg  NIPS: diploid XX Antibody:Negative (09/18 1537)  Anatomic [place] Normal anatomy scan, female gender Rubella: 17.40 (09/18 1537) Varicella:  Nonimmune  GTT Early:               Third trimester: 94 RPR: Non Reactive (09/18 1537)   Rhogam  HBsAg: Negative (09/18 1537)   TDaP vaccine     01/31/20                  Flu Shot: HIV: Non Reactive (09/18 1537)   Baby Food       Breast                         GBS:  negative  Contraception Options discussed 4/15 Pap:  CBB     CS/VBAC    Support Person                  Review of Systems: 10 point review of systems negative unless otherwise noted in HPI  Past Medical History: Patient Active Problem List   Diagnosis Date Noted  . Premature rupture of membranes 03/26/2020  . Hyperemesis complicating pregnancy, antepartum 09/22/2019  . Nausea and vomiting during pregnancy 09/08/2019  . Supervision of normal first pregnancy, antepartum 08/27/2019    Clinic [facility]ren[facility]  Dating 9wk5d ultrasound Blood type: O/Positive/-- (09/18 1537)   Genetic Screen  MSAFP:  neg  NIPS: diploid XX Antibody:Negative (09/18 1537)  Anatomic [place] Normal anatomy scan, female gender Rubella: 17.40 (09/18 1537) Varicella:  Nonimmune  GTT Early:               Third  trimester: 94 RPR: Non Reactive (09/18 1537)   Rhogam  HBsAg: Negative (09/18 1537)   TDaP vaccine     01/31/20                  Flu Shot: HIV: Non Reactive (09/18 1537)   Baby Food       Breast                         GBS:  negative  Contraception Options discussed 4/15 Pap:  CBB     CS/VBAC    Support Person             Past Surgical History: History reviewed. No pertinent surgical history.  Past Obstetric History: # 1 - Date: None, Sex: None, Weight: None, GA: None, Delivery: None, Apgar1: None, Apgar5: None, Living: None, Birth Comments: None   Past Gynecologic History:  Family History: Family History  Problem Relation Age of Onset  . Cancer Maternal Grandmother 61       breast  . Cancer Maternal Aunt 57  breast    Social History: Social History   Socioeconomic History  . Marital status: Single    Spouse name: Berneta Sages  . Number of children: Not on file  . Years of education: 2  . Highest education level: High school graduate  Occupational History  . Occupation: Unemployed  Tobacco Use  . Smoking status: Never Smoker  . Smokeless tobacco: Never Used  Substance and Sexual Activity  . Alcohol use: No  . Drug use: Yes    Types: Marijuana  . Sexual activity: Yes    Comment: Undecied  Other Topics Concern  . Not on file  Social History Narrative  . Not on file   Social Determinants of Health   Financial Resource Strain:   . Difficulty of Paying Living Expenses:   Food Insecurity:   . Worried About Charity fundraiser in the Last Year:   . Arboriculturist in the Last Year:   Transportation Needs:   . Film/video editor (Medical):   Marland Kitchen Lack of Transportation (Non-Medical):   Physical Activity:   . Days of Exercise per Week:   . Minutes of Exercise per Session:   Stress:   . Feeling of Stress :   Social Connections:   . Frequency of Communication with Friends and Family:   . Frequency of Social Gatherings with Friends and Family:   . Attends  Religious Services:   . Active Member of Clubs or Organizations:   . Attends Archivist Meetings:   Marland Kitchen Marital Status:   Intimate Partner Violence:   . Fear of Current or Ex-Partner:   . Emotionally Abused:   Marland Kitchen Physically Abused:   . Sexually Abused:     Medications: Prior to Admission medications   Medication Sig Start Date End Date Taking? Authorizing Provider  Cholecalciferol (VITAMIN D3) 1.25 MG (50000 UT) CAPS TK 1 C PO ONCE A WK 08/17/19  Yes [provider]  Ferrous Sulfate (IRON SUPPLEMENT PO) Take 1 capsule by mouth daily.   Yes [provider]  Prenatal Vit-Fe Fumarate-FA (PRENATAL VITAMINS) 28-0.8 MG TABS TK 1 T PO ONCE A DAY 08/17/19  Yes [provider]  Black Elderberry,Berry-Flower, 575 MG CAPS Take 1 capsule by mouth daily.    [provider]  senna-docusate (SENOKOT-S) 8.6-50 MG tablet Take 2 tablets at hs daily  until has BM Patient not taking: Reported on 03/26/2020 09/24/19   Dalia Heading, CNM    Allergies: No Known Allergies  Physical Exam: Vitals: Blood pressure 130/82, pulse 80, temperature 98.4 F (36.9 C), temperature source Oral, resp. rate 20, height 5\' 4"  (1.626 m), weight 80.7 kg, last menstrual period 06/04/2019.  Urine Dip Protein: N/A  FHT: 150, moderate, no accels, occasional small variable Toco: q31min  General: NAD HEENT: normocephalic, anicteric Pulmonary: No increased work of breathing Cardiovascular: RRR, distal pulses 2+ Abdomen: Gravid, non-tender [place]:vtx Genitourinary:  Dilation: 1.5 Effacement (%): 70 Cervical Position: Middle Station: -3 Exam by:: Harriet Pho, RN  Extremities: no edema, erythema, or tenderness Neurologic: Grossly intact Psychiatric: mood appropriate, affect full  Labs: No results found for this or any previous visit (from the past 24 hour(s)).  Assessment: 24 y.o. G1P0 [redacted]w[redacted]d by 04/06/2020, by Ultrasound prsenting with prelabor rupture of membranes  Plan: 1)  Ruptured membranes - observed contractions pattern, if does not continue to increase will start pitocin for augmentation purposes.  GBS negative no GBS ppx warranted  2) Fetus - cat I  3) PNL - Blood type  O/Positive/-- (09/18 1537) / Anti-bodyscreen Negative (09/18 1537) / Rubella 17.40 (09/18 1537) / Varicella Non-Immune / RPR Non Reactive (02/08 1108) / HBsAg Negative (09/18 1537) / HIV Non Reactive (02/08 1108) / 1-hr OGTT 94 / GBS Negative/-- (04/08 1631)  4) Immunization History -  Immunization History  Administered Date(s) Administered  . Tdap 01/31/2020    5) Disposition - pending delviery  Malachy Mood, MD, [place], [facility] 03/26/2020, 3:46 AM

## 2020-03-26 NOTE — Anesthesia Procedure Notes (Signed)
Epidural Patient location during procedure: OB Start time: 03/26/2020 3:12 PM End time: 03/26/2020 3:19 PM  Staffing Anesthesiologist: Piscitello, Precious Haws, MD Performed: anesthesiologist   Preanesthetic Checklist Completed: patient identified, IV checked, site marked, risks and benefits discussed, surgical consent, monitors and equipment checked, pre-op evaluation and timeout performed  Epidural Patient position: sitting Prep: ChloraPrep Patient monitoring: heart rate, continuous pulse ox and blood pressure Approach: midline Location: L3-L4 Injection technique: LOR saline  Needle:  Needle type: Tuohy  Needle gauge: 17 G Needle length: 9 cm and 9 Needle insertion depth: 7 cm Catheter type: closed end flexible Catheter size: 19 Gauge Catheter at skin depth: 12 cm Test dose: negative and 1.5% lidocaine with Epi 1:200 K  Assessment Sensory level: T10 Events: blood not aspirated, injection not painful, no injection resistance, no paresthesia and negative IV test  Additional Notes 1 attempt Pt. Evaluated and documentation done after procedure finished. Patient identified. Risks/Benefits/Options discussed with patient including but not limited to bleeding, infection, nerve damage, paralysis, failed block, incomplete pain control, headache, blood pressure changes, nausea, vomiting, reactions to medication both or allergic, itching and postpartum back pain. Confirmed with bedside nurse the patient's most recent platelet count. Confirmed with patient that they are not currently taking any anticoagulation, have any bleeding history or any family history of bleeding disorders. Patient expressed understanding and wished to proceed. All questions were answered. Sterile technique was used throughout the entire procedure. Please see nursing notes for vital signs. Test dose was given through epidural catheter and negative prior to continuing to dose epidural or start infusion. Warning signs of  high block given to the patient including shortness of breath, tingling/numbness in hands, complete motor block, or any concerning symptoms with instructions to call for help. Patient was given instructions on fall risk and not to get out of bed. All questions and concerns addressed with instructions to call with any issues or inadequate analgesia.   Patient tolerated the insertion well without immediate complications.Reason for block:procedure for pain

## 2020-03-27 DIAGNOSIS — Z3400 Encounter for supervision of normal first pregnancy, unspecified trimester: Secondary | ICD-10-CM

## 2020-03-27 DIAGNOSIS — O4202 Full-term premature rupture of membranes, onset of labor within 24 hours of rupture: Secondary | ICD-10-CM

## 2020-03-27 DIAGNOSIS — Z3A38 38 weeks gestation of pregnancy: Secondary | ICD-10-CM

## 2020-03-27 LAB — CBC
HCT: 29.2 % — ABNORMAL LOW (ref 36.0–46.0)
Hemoglobin: 9.6 g/dL — ABNORMAL LOW (ref 12.0–15.0)
MCH: 25.9 pg — ABNORMAL LOW (ref 26.0–34.0)
MCHC: 32.9 g/dL (ref 30.0–36.0)
MCV: 78.7 fL — ABNORMAL LOW (ref 80.0–100.0)
Platelets: 253 10*3/uL (ref 150–400)
RBC: 3.71 MIL/uL — ABNORMAL LOW (ref 3.87–5.11)
RDW: 16.9 % — ABNORMAL HIGH (ref 11.5–15.5)
WBC: 12.5 10*3/uL — ABNORMAL HIGH (ref 4.0–10.5)
nRBC: 0 % (ref 0.0–0.2)

## 2020-03-27 NOTE — Lactation Note (Signed)
This note was copied from a baby's chart. Lactation Consultation Note  Patient Name: Tammy Jefferson CNGFR'E Date: 03/27/2020 Reason for consult: Follow-up assessment;Mother's request;Primapara;Early term 37-38.6wks;Other (Comment)(Having difficulty latching to flattish right breast)  Tammy Jefferson was spitting and sleepy earlier in the day.  Once she started waking up giving feeding cues, mom was able to get her latched to the left breast without assistance from lactation.  The left nipple is easy to compress and evert.  Right nipple is more flat and not as easy to compress.  She can latch to left breast well now and has a strong, rhythmic suck.  She will latch to the right breast, but has difficulty sustaining the latch for long interval and [place] asleep.  Mom can easily hand express colostrum from the left breast, but it takes several tries to hand express a drop or 2 from the right breast which could be because she has only latched to the right breast a couple of times.  DEBP kit given and set up Symphony pump for mom to pre pump right breast to stimulate and help to evert for Tammy Jefferson to get a deeper latch.  Mom pre pumped right breast.  While she was pumping, Tammy Jefferson woke up.  We removed the pump and Tammy Jefferson latched to the right breast and sustained the latch longer, but was still not too interested in nursing and kept falling asleep.  Mom given shells to wear in her bra to help evert nipples for easier latch next time.  Mom has a [employer] pump at home.  She has [employer], but has not ordered her DEBP yet.  Discussed process of getting DEBP through insurance.  As first time mom, she had lots of questions which were  Addressed.  Reviewed normal newborn stomach size, adequate output, supply and demand, normal course of lactation and routine newborn feeding patterns.  Mom and baby are planning to be discharged home this evening.  Lactation Geophysicist/field seismologist with contact numbers given and reviewed  encouraging mom to call with any questions, concerns or follow up assistance. Maternal Data Formula Feeding for Exclusion: No Has patient been taught Hand Expression?: Yes Does the patient have breastfeeding experience prior to this delivery?: No(Gr1)  Feeding Feeding Type: Breast Fed  LATCH Score Latch: Repeated attempts needed to sustain latch, nipple held in mouth throughout feeding, stimulation needed to elicit sucking reflex.  Audible Swallowing: None  Type of Nipple: Flat  Comfort (Breast/Nipple): Soft / non-tender  Hold (Positioning): Assistance needed to correctly position infant at breast and maintain latch.  LATCH Score: 5  Interventions Interventions: Assisted with latch;Skin to skin;Breast massage;Hand express;Pre-pump if needed;Reverse pressure;Breast compression;Adjust position;Support pillows;Position options;Coconut oil;DEBP  Lactation Tools Discussed/Used Tools: Shells;Pump WIC Program: No(BCBS Insurance) Pump Review: Setup, frequency, and cleaning;Milk Storage;Other (comment) Initiated by:: Tammy Olshefski,RN,BSN,IBCLC Date initiated:: 03/27/20   Consult Status Consult Status: PRN    Tammy Jefferson 03/27/2020, 8:51 PM

## 2020-03-27 NOTE — Progress Notes (Signed)
Subjective:  Postpartum day 1: Patient reports doing well. Baby has breast fed well at delivery and has been spitting up some fluid. Patient is tolerating PO intake. Her pain is well controlled with PO medication. She is ambulating and voiding without difficulty.   Objective:  Vital signs in last 24 hours: Temp:  [98 F (36.7 C)-98.9 F (37.2 C)] 98.3 F (36.8 C) (04/19 0800) Pulse Rate:  [72-106] 79 (04/19 0800) Resp:  [18-20] 20 (04/19 0300) BP: (107-141)/(62-101) 125/85 (04/19 0800) SpO2:  [92 %-100 %] 100 % (04/19 0800)    General: NAD Pulmonary: no increased work of breathing Abdomen: non-distended, non-tender, fundus firm at level of umbilicus Extremities: no edema, no erythema, no tenderness  Results for orders placed or performed during the hospital encounter of 03/26/20 (from the past 72 hour(s))  Respiratory Panel by RT PCR (Flu A&B, Covid) - Nasopharyngeal Swab     Status: None   Collection Time: 03/26/20  3:48 AM   Specimen: Nasopharyngeal Swab  Result Value Ref Range   SARS Coronavirus 2 by RT PCR NEGATIVE NEGATIVE    Comment: (NOTE) SARS-CoV-2 target nucleic acids are NOT DETECTED. The SARS-CoV-2 RNA is generally detectable in upper respiratoy specimens during the acute phase of infection. The lowest concentration of SARS-CoV-2 viral copies this assay can detect is 131 copies/mL. A negative result does not preclude SARS-Cov-2 infection and should not be used as the sole basis for treatment or other patient management decisions. A negative result may occur with  improper specimen collection/handling, submission of specimen other than nasopharyngeal swab, presence of viral mutation(s) within the areas targeted by this assay, and inadequate number of viral copies (<131 copies/mL). A negative result must be combined with clinical observations, patient history, and epidemiological information. The expected result is Negative. Fact Sheet for Patients:   PinkCheek.be Fact Sheet for Healthcare Providers:  GravelBags.it This test is not yet ap proved or cleared by the [place] FDA and  has been authorized for detection and/or diagnosis of SARS-CoV-2 by FDA under an Emergency Use Authorization (EUA). This EUA will remain  in effect (meaning this test can be used) for the duration of the COVID-19 declaration under Section 564(b)(1) of the Act, 21 U.S.C. section 360bbb-3(b)(1), unless the authorization is terminated or revoked sooner.    Influenza A by PCR NEGATIVE NEGATIVE   Influenza B by PCR NEGATIVE NEGATIVE    Comment: (NOTE) The Xpert Xpress SARS-CoV-2/FLU/RSV assay is intended as an aid in  the diagnosis of influenza from Nasopharyngeal swab specimens and  should not be used as a sole basis for treatment. Nasal washings and  aspirates are unacceptable for Xpert Xpress SARS-CoV-2/FLU/RSV  testing. Fact Sheet for Patients: PinkCheek.be Fact Sheet for Healthcare Providers: GravelBags.it This test is not yet approved or cleared by the [place] FDA and  has been authorized for detection and/or diagnosis of SARS-CoV-2 by  FDA under an Emergency Use Authorization (EUA). This EUA will remain  in effect (meaning this test can be used) for the duration of the  Covid-19 declaration under Section 564(b)(1) of the Act, 21  U.S.C. section 360bbb-3(b)(1), unless the authorization is  terminated or revoked. Performed at [facility], [address] [place], [place] [address]   CBC     Status: Abnormal   Collection Time: 03/26/20  4:21 AM  Result Value Ref Range   WBC 5.9 4.0 - 10.5 K/uL   RBC 4.59 3.87 - 5.11 MIL/uL   Hemoglobin 11.8 (L) 12.0 -  15.0 g/dL   HCT 37.3 36.0 - 46.0 %   MCV 81.3 80.0 - 100.0 fL   MCH 25.7 (L) 26.0 - 34.0 pg   MCHC 31.6 30.0 - 36.0 g/dL   RDW 16.8 (H) 11.5 - 15.5 %    Platelets 292 150 - 400 K/uL   nRBC 0.0 0.0 - 0.2 %    Comment: Performed at [facility], [address] [place], [place] [address]  RPR     Status: None   Collection Time: 03/26/20  4:21 AM  Result Value Ref Range   RPR Ser Ql NON REACTIVE NON REACTIVE    Comment: Performed at [facility] [address], [place], [place] [address]  [place] rt PCR [facility] only)     Status: None   Collection Time: 03/26/20  4:21 AM   Specimen: Urine  Result Value Ref Range   Specimen source GC/Chlam URINE, RANDOM    Chlamydia Tr NOT DETECTED NOT DETECTED   N gonorrhoeae NOT DETECTED NOT DETECTED    Comment: (NOTE) This CT/NG assay has not been evaluated in patients with a history of  hysterectomy. Performed at [facility], [address] [place], [place] [address]   Urine Drug Screen, Qualitative [facility] only)     Status: None   Collection Time: 03/26/20  4:21 AM  Result Value Ref Range   Tricyclic, Ur Screen NONE DETECTED NONE DETECTED   Amphetamines, Ur Screen NONE DETECTED NONE DETECTED   MDMA (Ecstasy)Ur Screen NONE DETECTED NONE DETECTED   Cocaine Metabolite,Ur [place] NONE DETECTED NONE DETECTED   Opiate, Ur Screen NONE DETECTED NONE DETECTED   Phencyclidine (PCP) Ur S NONE DETECTED NONE DETECTED   Cannabinoid 50 Ng, Ur [place] NONE DETECTED NONE DETECTED   Barbiturates, Ur Screen NONE DETECTED NONE DETECTED   Benzodiazepine, Ur Scrn NONE DETECTED NONE DETECTED   Methadone Scn, Ur NONE DETECTED NONE DETECTED    Comment: (NOTE) Tricyclics + metabolites, urine    Cutoff 1000 ng/mL Amphetamines + metabolites, urine  Cutoff 1000 ng/mL MDMA (Ecstasy), urine              Cutoff 500 ng/mL Cocaine Metabolite, urine          Cutoff 300 ng/mL Opiate + metabolites, urine        Cutoff 300 ng/mL Phencyclidine (PCP), urine         Cutoff 25 ng/mL Cannabinoid, urine                 Cutoff 50 ng/mL Barbiturates + metabolites, urine  Cutoff 200 ng/mL Benzodiazepine, urine               Cutoff 200 ng/mL Methadone, urine                   Cutoff 300 ng/mL The urine drug screen provides only a preliminary, unconfirmed analytical test result and should not be used for non-medical purposes. Clinical consideration and professional judgment should be applied to any positive drug screen result due to possible interfering substances. A more specific alternate chemical method must be used in order to obtain a confirmed analytical result. Gas chromatography / mass spectrometry (GC/MS) is the preferred confirmat ory method. Performed at [facility], [address] [place], [place] [address]   Type and screen     Status: None   Collection Time: 03/26/20  6:08 AM  Result Value Ref Range   ABO/RH(D) O POS    Antibody Screen NEG  Sample Expiration      03/29/2020,2359 Performed at [facility], [address] [place], [place] [address]   CBC     Status: Abnormal   Collection Time: 03/27/20  5:22 AM  Result Value Ref Range   WBC 12.5 (H) 4.0 - 10.5 K/uL   RBC 3.71 (L) 3.87 - 5.11 MIL/uL   Hemoglobin 9.6 (L) 12.0 - 15.0 g/dL   HCT 29.2 (L) 36.0 - 46.0 %   MCV 78.7 (L) 80.0 - 100.0 fL   MCH 25.9 (L) 26.0 - 34.0 pg   MCHC 32.9 30.0 - 36.0 g/dL   RDW 16.9 (H) 11.5 - 15.5 %   Platelets 253 150 - 400 K/uL   nRBC 0.0 0.0 - 0.2 %    Comment: Performed at [facility], [address] [place], [place] [address]    Assessment:   24 y.o. G1P1001 postpartum day # 1, lactating  Plan:    1) Acute blood loss anemia - hemodynamically stable and asymptomatic - po ferrous sulfate  2) Blood Type --/--/O POS (04/18 [id number]) / Rubella 17.40 (09/18 1537) / Varicella Non-Immune  3) TDAP status up to date  4) Feeding plan breast  5) Contraception: undecided  6) Disposition: continue current care   Rod Can, [facility] 03/27/2020, 9:38 AM

## 2020-03-27 NOTE — Plan of Care (Signed)
Transferred to Room 341. Alert and oriented with pleasant affect. VS and Assessment WNL. Oriented to Room, Instructed in Fall Prevention and Safety and Security. Pt. V/O.

## 2020-03-27 NOTE — Progress Notes (Signed)
Patient discharged home with infant. Discharge instructions reviewed and given to pt. Pt verbalized understanding, no questions at this time. Escorted out by staff.

## 2020-03-27 NOTE — Anesthesia Postprocedure Evaluation (Signed)
Anesthesia Post Note  Patient: Tammy Jefferson  Procedure(s) Performed: AN AD HOC LABOR EPIDURAL  Patient location during evaluation: Mother Baby Anesthesia Type: Epidural Level of consciousness: awake and alert Pain management: pain level controlled Vital Signs Assessment: post-procedure vital signs reviewed and stable Respiratory status: spontaneous breathing, nonlabored ventilation and respiratory function stable Cardiovascular status: stable Postop Assessment: no headache, no backache and epidural receding Anesthetic complications: no     Last Vitals:  Vitals:   03/27/20 0000 03/27/20 0300  BP: 128/73 107/67  Pulse: 84 (!) 101  Resp: 20 20  Temp: 37 C 36.7 C  SpO2: 100% 98%    Last Pain:  Vitals:   03/27/20 0300  TempSrc: Oral  PainSc:                  Estill Batten

## 2020-03-30 ENCOUNTER — Encounter: Payer: Self-pay | Admitting: Advanced Practice Midwife

## 2020-03-30 ENCOUNTER — Ambulatory Visit (INDEPENDENT_AMBULATORY_CARE_PROVIDER_SITE_OTHER): Payer: BC Managed Care – PPO | Admitting: Advanced Practice Midwife

## 2020-03-30 ENCOUNTER — Other Ambulatory Visit: Payer: Self-pay

## 2020-03-30 DIAGNOSIS — S3141XA Laceration without foreign body of vagina and vulva, initial encounter: Secondary | ICD-10-CM | POA: Diagnosis not present

## 2020-03-30 NOTE — Patient Instructions (Signed)
Vaginal Laceration  A vaginal laceration is a cut or tear of the opening of the vagina, the inside of the vaginal canal, or the skin between the vaginal opening and the anus (perineum). What are the causes? This condition may be caused by:  Childbirth. It may also be caused by tools that are used to help deliver a baby, such as forceps.  Sex.  An injury from sports, bike riding, or other activities.  Thinning, dryness, or irritation of the vagina due to low estrogen levels (vulvovaginal atrophy). What increases the risk? You are more likely to develop this condition if you:  Give birth vaginally.  Are sexually active.  Have gone through menopause.  Have low estrogen levels due to certain medicines, breast cancer treatments, or breastfeeding. What are the signs or symptoms? Symptoms of this condition include:  Slight to heavy vaginal bleeding.  Vaginal swelling.  Mild to severe pain.  Vaginal tenderness.  Painful urination.  Pain or discomfort during sex. How is this diagnosed? If the tear happened during childbirth, your health care provider can diagnose the tear at that time. Other tears or lacerations can be diagnosed with your medical history and a physical exam. You may have other tests, including:  Blood tests to check your hormone levels and blood loss.  Imaging tests, such as an ultrasonogram or CT scan, to rule out other health issues, such as enlarged lymph nodes or tumors. How is this treated? Treatment depends on the severity of the tear or laceration. Minor injuries may heal on their own. If needed, this condition may be treated with:  Stitches (sutures).  Medicines, such as: ? Creams to reduce pain. ? Vaginal lubricants to treat vaginal dryness. ? Topical or oral hormonal therapy. ? Antibiotics. These may be taken orally or given as ointments to prevent or treat infection. Surgery may be needed if the tear is severe. Follow these instructions at  home: Wound care  Follow instructions from your health care provider about how to take care of your wound. Make sure you: ? Wash your hands with soap and water before and after you change your bandage (dressing). If soap and water are not available, use hand sanitizer. ? Change your dressing as told by your health care provider. ? Keep the area clean. ? Leave sutures in place, if this applies. These skin closures may need to stay in place for 2 weeks or longer.  Check your wound every day for signs of infection. Check for: ? Redness, swelling, or pain. ? Fluid or blood. ? Warmth. ? Pus or a bad smell. Managing pain and swelling   If directed, put ice on the injured area: ? Put ice in a plastic bag. ? Place a towel between your skin and the bag. ? Leave the ice on for 20 minutes, 2-3 times a day. Medicines  Take or apply over-the-counter and prescription medicines only as told by your health care provider.  If you were prescribed an antibiotic medicine, take it as told by your health care provider. Do not stop using the antibiotic even if you start to feel better.  Ask your health care provider if the medicine prescribed to you: ? Requires you to avoid driving or using heavy machinery. ? Can cause constipation. You may need to take actions to prevent or treat constipation, such as:  Drink enough fluid to keep your urine pale yellow.  Take over-the-counter or prescription medicines.  Eat foods that are high in fiber, such as beans,   whole grains, and fresh fruits and vegetables.  Limit foods that are high in fat and processed sugars, such as fried or sweet foods. General instructions   Take a sitz bath 2-3 times a day or as told by your health care provider. A sitz bath is a shallow, warm water bath that is taken while you are sitting down. The water should only come up to your hips and should cover your buttocks.  Avoid sitting or standing for long periods of time.  Lie on  your side while sleeping or resting.  Avoid straining during bowel movements. Ask your health care provider if a stool softener is needed.  Do not douche, use a tampon, or have sex until your health care provider approves.  Keep all follow-up visits as told by your health care provider. This is important. Contact a health care provider if:  You have: ? More redness, swelling, or pain in the vaginal area. ? More fluid or blood coming from your vaginal tear. ? Pus or a bad smell coming from your vaginal area. ? A fever. ? A tear that breaks open after it healed or was repaired. ? A burning pain when you urinate.  You continue to have pain during sex after the tear heals.  You are urinating more often than usual or feel an increased urgency to urinate. Get help right away if you:  Feel light-headed.  Have nausea or vomiting.  Have severe pain around your vagina or in your pelvis or lower abdomen.  Have heavy vaginal bleeding, or you are soaking more than 1 pad an hour. Summary  A vaginal laceration is a cut or tear of the opening of the vagina, the inside of the vaginal canal, or the skin between the vaginal opening and the anus (perineum).  It is caused by childbirth, sex, injury, or thinning, dryness, or irritation of the vagina due to low estrogen levels.  Treatment depends on the severity of the tear or laceration. Minor injuries may heal on their own. It may be treated with stitches, medicines, or surgery if the tear is severe. This information is not intended to replace advice given to you by your health care provider. Make sure you discuss any questions you have with your health care provider. Document Revised: 07/09/2018 Document Reviewed: 07/09/2018 Elsevier Patient Education  2020 Elsevier Inc.  

## 2020-03-30 NOTE — Progress Notes (Signed)
Patient ID: Tammy Jefferson, female   DOB: [birth year], 24 y.o.   MRN: [id number]  Reason for Consult: Postpartum Care (vaginal delivery 4/18) and Vaginal Pain (tear on left side of labia)   Subjective:  Date of Service: 03/30/2020  HPI:  Tammy Jefferson is a 24 y.o. female postpartum day 4 with complaint of left labial pain especially with urinating. The patient had an uncomplicated labor and delivery on 03/26/2020. This is her first pregnancy/baby. Baby was 6 pounds 7 ounces at delivery. Mom is breastfeeding and reports no breast problems.   We discussed comfort measures for labial laceration. She did not know to spray peri bottle while urinating to dilute urine. Also recommended she soak in tub with sea salt daily, and pat dry.  Past Medical History:  Diagnosis Date  . Cyst (solitary) of breast    Family History  Problem Relation Age of Onset  . Cancer Maternal Grandmother 58       breast  . Cancer Maternal Aunt 72       breast   History reviewed. No pertinent surgical history.  Short Social History:  Social History   Tobacco Use  . Smoking status: Never Smoker  . Smokeless tobacco: Never Used  Substance Use Topics  . Alcohol use: No    No Known Allergies  Current Outpatient Medications  Medication Sig Dispense Refill  . Ferrous Sulfate (IRON SUPPLEMENT PO) Take 1 capsule by mouth daily.    . Prenatal Vit-Fe Fumarate-FA (PRENATAL VITAMINS) 28-0.8 MG TABS TK 1 T PO ONCE A DAY     No current facility-administered medications for this visit.    Review of Systems  Constitutional: Negative for chills and fever.  HENT: Negative for congestion, ear discharge, ear pain, hearing loss, sinus pain and sore throat.   Eyes: Negative for blurred vision and double vision.  Respiratory: Negative for cough, shortness of breath and wheezing.   Cardiovascular: Negative for chest pain, palpitations and leg swelling.  Gastrointestinal: Negative for abdominal pain, blood in stool,  constipation, diarrhea, heartburn, melena, nausea and vomiting.  Genitourinary: Negative for dysuria, flank pain, frequency, hematuria and urgency.       Left labial pain  Musculoskeletal: Negative for back pain, joint pain and myalgias.  Skin: Negative for itching and rash.  Neurological: Negative for dizziness, tingling, tremors, sensory change, speech change, focal weakness, seizures, loss of consciousness, weakness and headaches.  Endo/Heme/Allergies: Negative for environmental allergies. Does not bruise/bleed easily.  Psychiatric/Behavioral: Negative for depression, hallucinations, memory loss, substance abuse and suicidal ideas. The patient is not nervous/anxious and does not have insomnia.         Objective:  Objective   Vitals:   03/30/20 1136  BP: (!) 142/85  Pulse: 89  Weight: 171 lb (77.6 kg)   Body mass index is 29.35 kg/m. Constitutional: Well nourished, well developed female in no acute distress.  HEENT: normal Skin: Warm and dry.  Extremities: no edema Respiratory: Clear to auscultation bilateral. Normal respiratory effort Neuro: DTRs 2+, Cranial nerves grossly intact Psych: Alert and Oriented x3. No memory deficits. Normal mood and affect.  MS: normal gait, normal bilateral lower extremity ROM/strength/stability.  Pelvic exam:  is not limited by body habitus EGBUS: within normal limits and small, shallow, lower left labial tear- healing Vagina: within normal limits and with normal mucosa, blood in the vault, no other lacerations noted Cervix: not evaluated   Assessment/Plan:     24 y.o. G1 P1001 day 4 postpartum with left  labial tear  Use peri bottle to dilute urine Daily tub soak with sea salt Pat dry Return to clinic for worsening symptoms   [facility] 03/31/2020, 11:02 PM

## 2020-03-31 ENCOUNTER — Encounter: Payer: BC Managed Care – PPO | Admitting: Certified Nurse Midwife

## 2020-05-05 ENCOUNTER — Encounter: Payer: Self-pay | Admitting: Obstetrics and Gynecology

## 2020-05-05 ENCOUNTER — Other Ambulatory Visit (HOSPITAL_COMMUNITY)
Admission: RE | Admit: 2020-05-05 | Discharge: 2020-05-05 | Disposition: A | Discharge status: Home / Self Care | Payer: BC Managed Care – PPO | Source: Ambulatory Visit | Attending: Obstetrics and Gynecology | Admitting: Obstetrics and Gynecology

## 2020-05-05 ENCOUNTER — Other Ambulatory Visit: Payer: Self-pay

## 2020-05-05 ENCOUNTER — Ambulatory Visit (INDEPENDENT_AMBULATORY_CARE_PROVIDER_SITE_OTHER): Payer: BC Managed Care – PPO | Admitting: Obstetrics and Gynecology

## 2020-05-05 VITALS — BP 108/62 | HR 87 | Ht 63.0 in | Wt 158.0 lb

## 2020-05-05 DIAGNOSIS — Z124 Encounter for screening for malignant neoplasm of cervix: Secondary | ICD-10-CM | POA: Insufficient documentation

## 2020-05-05 DIAGNOSIS — Z3009 Encounter for other general counseling and advice on contraception: Secondary | ICD-10-CM

## 2020-05-05 NOTE — Patient Instructions (Signed)
Hormonal Contraception Information Hormonal contraception is a type of birth control that uses hormones to prevent pregnancy. It usually involves a combination of the hormones estrogen and progesterone or only the hormone progesterone. Hormonal contraception works in these ways:  It thickens the mucus in the cervix, making it harder for sperm to enter the uterus.  It changes the lining of the uterus, making it harder for an egg to implant.  It may stop the ovaries from releasing eggs (ovulation). Some women who take hormonal contraceptives that contain only progesterone may continue to ovulate. Hormonal contraception cannot prevent sexually transmitted infections (STIs). Pregnancy may still occur. Estrogen and progesterone contraceptives Contraceptives that use a combination of estrogen and progesterone are available in these forms:  Pill. Pills come in different combinations of hormones. They must be taken at the same time each day. Pills can affect your period, causing you to get your period once every three months or not at all.  Patch. The patch must be worn on the lower abdomen for three weeks and then removed on the fourth.  Vaginal ring. The ring is placed in the vagina and left there for three weeks. It is then removed for one week. Progesterone contraceptives Contraceptives that use progesterone only are available in these forms:  Pill. Pills should be taken every day of the cycle.  Intrauterine device (IUD). This device is inserted into the uterus and removed or replaced every five years or sooner.  Implant. Plastic rods are placed under the skin of the upper arm. They are removed or replaced every three years or sooner.  Injection. The injection is given once every 90 days. What are the side effects? The side effects of estrogen and progesterone contraceptives include:  Nausea.  Headaches.  Breast tenderness.  Bleeding or spotting between menstrual cycles.  High blood  pressure (rare).  Strokes, heart attacks, or blood clots (rare) Side effects of progesterone-only contraceptives include:  Nausea.  Headaches.  Breast tenderness.  Unpredictable menstrual bleeding.  High blood pressure (rare). Talk to your health care provider about what side effects may affect you. Where to find more information  Ask your health care provider for more information and resources about hormonal contraception.  [place] Department of Health and Human Services Office on Women's Health: www.womenshealth.gov Questions to ask:  What type of hormonal contraception is right for me?  How long should I plan to use hormonal contraception?  What are the side effects of the hormonal contraception method I choose?  How can I prevent STIs while using hormonal contraception? Contact a health care provider if:  You start taking hormonal contraceptives and you develop persistent or severe side effects. Summary  Estrogen and progesterone are hormones used in many forms of birth control.  Talk to your health care provider about what side effects may affect you.  Hormonal contraception cannot prevent sexually transmitted infections (STIs).  Ask your health care provider for more information and resources about hormonal contraception. This information is not intended to replace advice given to you by your health care provider. Make sure you discuss any questions you have with your health care provider. Document Revised: 03/22/2019 Document Reviewed: 10/25/2016 Elsevier Patient Education  2020 Elsevier Inc.  

## 2020-05-05 NOTE — Progress Notes (Signed)
Postpartum Visit  Chief Complaint:  Chief Complaint  Patient presents with  . Postpartum Care    Vaginal delivery 4/18    History of Present Illness: Patient is a 24 y.o. G1P1001 presents for postpartum visit. Date of delivery: 03/26/2020 Type of delivery: Vaginal delivery - Vacuum or forceps assisted  no Episiotomy No.  Laceration: no  Pregnancy or labor problems:  no Any problems since the delivery:  no  Newborn Details:  SINGLETON :  1. BabyGender female ([place]) Birth weight: 6lbs 7oz (2910g) Maternal Details:  Breast or formula feeding: plans to breastfeed Contraception after delivery: No  Any bowel or bladder issues: No  Post partum depression/anxiety noted:  no Edinburgh Post-Partum Depression Score:2  Review of Systems: Review of Systems  Constitutional: Negative.   Gastrointestinal: Negative.   Genitourinary: Negative.   Psychiatric/Behavioral: Negative.     The following portions of the patient's history were reviewed and updated as appropriate: allergies, current medications, past family history, past medical history, past social history, past surgical history and problem list.  Past Medical History:  Past Medical History:  Diagnosis Date  . Cyst (solitary) of breast     Past Surgical History:  History reviewed. No pertinent surgical history.  Family History:  Family History  Problem Relation Age of Onset  . Cancer Maternal Grandmother 5       breast  . Cancer Maternal Aunt 65       breast    Social History:  Social History   Socioeconomic History  . Marital status: Single    Spouse name: Berneta Sages  . Number of children: Not on file  . Years of education: 56  . Highest education level: High school graduate  Occupational History  . Occupation: Unemployed  Tobacco Use  . Smoking status: Never Smoker  . Smokeless tobacco: Never Used  Substance and Sexual Activity  . Alcohol use: No  . Drug use: Yes    Types: Marijuana  . Sexual  activity: Yes    Comment: Undecied  Other Topics Concern  . Not on file  Social History Narrative  . Not on file   Social Determinants of Health   Financial Resource Strain:   . Difficulty of Paying Living Expenses:   Food Insecurity:   . Worried About Charity fundraiser in the Last Year:   . Arboriculturist in the Last Year:   Transportation Needs:   . Film/video editor (Medical):   Marland Kitchen Lack of Transportation (Non-Medical):   Physical Activity:   . Days of Exercise per Week:   . Minutes of Exercise per Session:   Stress:   . Feeling of Stress :   Social Connections:   . Frequency of Communication with Friends and Family:   . Frequency of Social Gatherings with Friends and Family:   . Attends Religious Services:   . Active Member of Clubs or Organizations:   . Attends Archivist Meetings:   Marland Kitchen Marital Status:   Intimate Partner Violence:   . Fear of Current or Ex-Partner:   . Emotionally Abused:   Marland Kitchen Physically Abused:   . Sexually Abused:     Allergies:  No Known Allergies  Medications: Prior to Admission medications   Medication Sig Start Date End Date Taking? Authorizing Provider  Ferrous Sulfate (IRON SUPPLEMENT PO) Take 1 capsule by mouth daily.    [provider]  Prenatal Vit-Fe Fumarate-FA (PRENATAL VITAMINS) 28-0.8 MG TABS TK 1 T PO  ONCE A DAY 08/17/19   [provider]    Physical Exam Blood pressure 108/62, pulse 87, height 5\' 3"  (1.6 m), weight 158 lb (71.7 kg), currently breastfeeding.    General: NAD HEENT: normocephalic, anicteric Pulmonary: No increased work of breathing Abdomen: NABS, soft, non-tender, non-distended.  Umbilicus without lesions.  No hepatomegaly, splenomegaly or masses palpable. No evidence of hernia. Genitourinary:  External: Normal external female genitalia.  Normal urethral meatus, normal  Bartholin's and Skene's glands.    Vagina: Normal vaginal mucosa, no evidence of prolapse.    Cervix: Grossly  normal in appearance, no bleeding  Uterus: Non-enlarged, mobile, normal contour.  No CMT  Adnexa: ovaries non-enlarged, no adnexal masses  Rectal: deferred Extremities: no edema, erythema, or tenderness Neurologic: Grossly intact Psychiatric: mood appropriate, affect full  Edinburgh Postnatal Depression Scale - 05/05/20 1334      Edinburgh Postnatal Depression Scale:  In the Past 7 Days   I have been able to laugh and see the funny side of things.  0    I have looked forward with enjoyment to things.  0    I have blamed myself unnecessarily when things went wrong.  1    I have been anxious or worried for no good reason.  0    I have felt scared or panicky for no good reason.  1    Things have been getting on top of me.  0    I have been so unhappy that I have had difficulty sleeping.  0    I have felt sad or miserable.  0    I have been so unhappy that I have been crying.  0    The thought of harming myself has occurred to me.  0    Edinburgh Postnatal Depression Scale Total  2       Assessment: 24 y.o. G1P1001 presenting for 6 week postpartum visit  Plan: Problem List Items Addressed This Visit    None    Visit Diagnoses    Screening for malignant neoplasm of cervix    -  Primary   Relevant Orders   Cytology - PAP   6 weeks postpartum follow-up           1) Contraception - Education given regarding options for contraception, as well as compatibility with breast feeding if applicable.  Patient plans on no contraception at present.  Discussed contraceptive methods compatible with breastfeeding including mini-pill, depo provera, IUD, nexplanon.  2)  Pap - ASCCP guidelines and rational discussed.  ASCCP guidelines and rational discussed.  Patient opts for every 3 years screening interval  3) Patient underwent screening for postpartum depression with no signs of depression  4) Return in about 1 year (around 05/05/2021) for annual.   Malachy Mood, MD, [place], [facility] 05/05/2020, 1:53 PM

## 2020-05-10 LAB — CYTOLOGY - PAP
Diagnosis: NEGATIVE
Diagnosis: REACTIVE

## 2020-10-16 ENCOUNTER — Encounter: Payer: Self-pay | Admitting: Emergency Medicine

## 2020-10-16 ENCOUNTER — Other Ambulatory Visit: Payer: Self-pay

## 2020-10-16 ENCOUNTER — Ambulatory Visit
Admission: EM | Admit: 2020-10-16 | Discharge: 2020-10-16 | Disposition: A | Discharge status: Home / Self Care | Payer: BC Managed Care – PPO | Attending: Emergency Medicine | Admitting: Emergency Medicine

## 2020-10-16 DIAGNOSIS — Z20822 Contact with and (suspected) exposure to covid-19: Secondary | ICD-10-CM | POA: Insufficient documentation

## 2020-10-16 LAB — SARS CORONAVIRUS 2 (TAT 6-24 HRS): SARS Coronavirus 2: NEGATIVE

## 2020-10-16 NOTE — Discharge Instructions (Signed)

## 2020-10-16 NOTE — ED Triage Notes (Signed)
Patient was exposed to Tammy Jefferson on Saturday. Patient has no symptoms today.
